# Patient Record
Sex: Male | Born: 1990
Health system: Southern US, Community
[De-identification: ages and names within clinical notes are randomized; demographics above are authoritative.]

## PROBLEM LIST (undated history)

## (undated) DIAGNOSIS — F29 Unspecified psychosis not due to a substance or known physiological condition: Secondary | ICD-10-CM

## (undated) DIAGNOSIS — Z8659 Personal history of other mental and behavioral disorders: Secondary | ICD-10-CM

---
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
## 2018-12-29 ENCOUNTER — Other Ambulatory Visit: Payer: Self-pay | Admitting: *Deleted

## 2018-12-29 DIAGNOSIS — Z20822 Contact with and (suspected) exposure to covid-19: Secondary | ICD-10-CM

## 2018-12-31 LAB — NOVEL CORONAVIRUS, NAA: SARS-CoV-2, NAA: NOT DETECTED

## 2019-01-20 ENCOUNTER — Emergency Department (HOSPITAL_COMMUNITY)
Admission: EM | Admit: 2019-01-20 | Discharge: 2019-01-22 | Disposition: A | Discharge status: Other Acute Inpatient Hospital | Payer: Self-pay | Attending: Emergency Medicine | Admitting: Emergency Medicine

## 2019-01-20 DIAGNOSIS — R4689 Other symptoms and signs involving appearance and behavior: Secondary | ICD-10-CM

## 2019-01-20 DIAGNOSIS — Z79899 Other long term (current) drug therapy: Secondary | ICD-10-CM | POA: Insufficient documentation

## 2019-01-20 DIAGNOSIS — Z20828 Contact with and (suspected) exposure to other viral communicable diseases: Secondary | ICD-10-CM | POA: Insufficient documentation

## 2019-01-20 DIAGNOSIS — F23 Brief psychotic disorder: Secondary | ICD-10-CM | POA: Diagnosis present

## 2019-01-20 DIAGNOSIS — F17200 Nicotine dependence, unspecified, uncomplicated: Secondary | ICD-10-CM | POA: Insufficient documentation

## 2019-01-20 DIAGNOSIS — R456 Violent behavior: Secondary | ICD-10-CM | POA: Insufficient documentation

## 2019-01-20 DIAGNOSIS — F3113 Bipolar disorder, current episode manic without psychotic features, severe: Secondary | ICD-10-CM | POA: Insufficient documentation

## 2019-01-20 DIAGNOSIS — Z046 Encounter for general psychiatric examination, requested by authority: Secondary | ICD-10-CM | POA: Insufficient documentation
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
## 2019-01-20 NOTE — ED Notes (Signed)
Bed: WA02 Expected date:  Expected time:  Means of arrival:  Comments: 

## 2019-01-21 ENCOUNTER — Encounter (HOSPITAL_COMMUNITY): Payer: Self-pay | Admitting: Obstetrics and Gynecology

## 2019-01-21 LAB — COMPREHENSIVE METABOLIC PANEL
ALT: 23 U/L (ref 0–44)
AST: 24 U/L (ref 15–41)
Albumin: 4 g/dL (ref 3.5–5.0)
Alkaline Phosphatase: 59 U/L (ref 38–126)
Anion gap: 6 (ref 5–15)
BUN: 22 mg/dL — ABNORMAL HIGH (ref 6–20)
CO2: 25 mmol/L (ref 22–32)
Calcium: 8.8 mg/dL — ABNORMAL LOW (ref 8.9–10.3)
Chloride: 109 mmol/L (ref 98–111)
Creatinine, Ser: 1.22 mg/dL (ref 0.61–1.24)
GFR calc Af Amer: >60 mL/min (ref >60)
GFR calc non Af Amer: >60 mL/min (ref >60)
Glucose, Bld: 106 mg/dL — ABNORMAL HIGH (ref 70–99)
Potassium: 4 mmol/L (ref 3.5–5.1)
Sodium: 140 mmol/L (ref 135–145)
Total Bilirubin: 0.5 mg/dL (ref 0.3–1.2)
Total Protein: 7.1 g/dL (ref 6.5–8.1)

## 2019-01-21 LAB — CBC WITH DIFFERENTIAL/PLATELET
Abs Immature Granulocytes: 0.01 10*3/uL (ref 0.00–0.07)
Basophils Absolute: 0 10*3/uL (ref 0.0–0.1)
Basophils Relative: 0 %
Eosinophils Absolute: 0.1 10*3/uL (ref 0.0–0.5)
Eosinophils Relative: 2 %
HCT: 40.8 % (ref 39.0–52.0)
Hemoglobin: 13 g/dL (ref 13.0–17.0)
Immature Granulocytes: 0 %
Lymphocytes Relative: 28 %
Lymphs Abs: 1.5 10*3/uL (ref 0.7–4.0)
MCH: 30.4 pg (ref 26.0–34.0)
MCHC: 31.9 g/dL (ref 30.0–36.0)
MCV: 95.3 fL (ref 80.0–100.0)
Monocytes Absolute: 0.5 10*3/uL (ref 0.1–1.0)
Monocytes Relative: 10 %
Neutro Abs: 3.2 10*3/uL (ref 1.7–7.7)
Neutrophils Relative %: 60 %
Platelets: 236 10*3/uL (ref 150–400)
RBC: 4.28 MIL/uL (ref 4.22–5.81)
RDW: 11.7 % (ref 11.5–15.5)
WBC: 5.4 10*3/uL (ref 4.0–10.5)
nRBC: 0 % (ref 0.0–0.2)

## 2019-01-21 LAB — CBG MONITORING, ED: Glucose-Capillary: 105 mg/dL — ABNORMAL HIGH (ref 70–99)

## 2019-01-21 LAB — RAPID URINE DRUG SCREEN, HOSP PERFORMED
Amphetamines: NOT DETECTED
Barbiturates: NOT DETECTED
Benzodiazepines: NOT DETECTED
Cocaine: NOT DETECTED
Opiates: NOT DETECTED
Tetrahydrocannabinol: POSITIVE — AB

## 2019-01-21 LAB — SARS CORONAVIRUS 2 BY RT PCR (HOSPITAL ORDER, PERFORMED IN ~~LOC~~ HOSPITAL LAB): SARS Coronavirus 2: NEGATIVE

## 2019-01-21 LAB — ETHANOL: Alcohol, Ethyl (B): <10 mg/dL (ref <10)

## 2019-01-21 LAB — ACETAMINOPHEN LEVEL: Acetaminophen (Tylenol), Serum: <10 ug/mL — ABNORMAL LOW (ref 10–30)

## 2019-01-21 LAB — SALICYLATE LEVEL: Salicylate Lvl: <7 mg/dL (ref 2.8–30.0)

## 2019-01-21 MED ORDER — RISPERIDONE 0.5 MG PO TABS
0.5000 mg | ORAL_TABLET | Freq: Two times a day (BID) | ORAL | Status: DC
  Filled 2019-01-21 (×2): qty 1

## 2019-01-21 MED ORDER — DIPHENHYDRAMINE HCL 25 MG PO CAPS
25.0000 mg | ORAL_CAPSULE | Freq: Once | ORAL | Status: AC
  Administered 2019-01-21: 25 mg via ORAL
  Filled 2019-01-21: qty 1
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
## 2019-01-21 NOTE — ED Notes (Addendum)
PT ambulatory w/o difficulty from ED to room 31.  One pt belongings bag locked in locker #31

## 2019-01-21 NOTE — ED Notes (Signed)
Pt does not feel that he needs medication, he will talk with the MD about the medication

## 2019-01-21 NOTE — ED Notes (Signed)
Bed: RESA Expected date:  Expected time:  Means of arrival:  Comments: Room 2

## 2019-01-21 NOTE — ED Notes (Signed)
Talking to security

## 2019-01-21 NOTE — ED Notes (Signed)
Sitter order placed by this RN.  

## 2019-01-21 NOTE — ED Notes (Signed)
Patient walked out of room wanting to go home. Explained to patient that he is IVC'd and waiting for a psych consult. Patient went back into room. Patient refused food tray.

## 2019-01-21 NOTE — ED Notes (Signed)
TSS in room interviewing

## 2019-01-21 NOTE — ED Notes (Signed)
Patient found using computer. Made patient aware he is not allowed to use computer. Redirected patient. Patient states he understands.

## 2019-01-21 NOTE — ED Notes (Signed)
Pt found using the computer at bedside for staff. Pt told he was not allowed to use that computer and to please refrain from use. Sitter at bedside.

## 2019-01-21 NOTE — ED Notes (Addendum)
Spoke with pt's mom and she reports that he does not have a significant psych hx, and that she wants him to return to [facility] to receive treatment.  She is also requesting to speak with the MD.  Mom is aware that the pt is not going to be released.  MD/PA is aware of mom's request.

## 2019-01-21 NOTE — ED Notes (Signed)
Pt's belongings at [place] in cabinets above [place]. Pt gave writer his shirt, pants, socks and shoes and changed into paper scrubs. Pt's cell phone was given to primary RN Wille Glaser which was then placed with his belongings.   Pt had no other belongings when this Probation officer received pt.

## 2019-01-21 NOTE — ED Notes (Signed)
On the phone 

## 2019-01-21 NOTE — ED Notes (Signed)
TTS machine placed at bedside.   

## 2019-01-21 NOTE — ED Notes (Signed)
On the phone talking to his mother

## 2019-01-21 NOTE — Progress Notes (Signed)
Received Daniel Mcguire at the change of shift in his room awake in bed. He stated he is doing fantastic. He refused his Risperdal after several attempts and slept until 0330 hrs. He was restless and pacing in his room. He eventually showered and his linens were changed on his bed.
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
## 2019-01-21 NOTE — ED Notes (Signed)
Bed: QD82 Expected date:  Expected time:  Means of arrival:  Comments: resa
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
## 2019-01-21 NOTE — ED Notes (Signed)
RN gave patient paper scrubs to change into. After giving him the scrubs patient stated "Before I leave here can I get some real scrubs like you guys wear? I know you have some just lying around." NT explained to patient that we buy our scrubs. Patient unsatisfied with this answer

## 2019-01-21 NOTE — ED Notes (Signed)
In the bathroom to shower and change scrubs 

## 2019-01-21 NOTE — ED Notes (Signed)
Patient found using the computer in room for messenger chat. Patient logged out quickly and unable to see what patient was typing.   This RN made patient aware I will call security if he does not stop trying to use the computer.    Computer turned off.

## 2019-01-21 NOTE — ED Notes (Signed)
TTS at bedside. 
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
## 2019-01-22 ENCOUNTER — Other Ambulatory Visit: Payer: Self-pay

## 2019-01-22 ENCOUNTER — Inpatient Hospital Stay (HOSPITAL_COMMUNITY)
Admission: AD | Admit: 2019-01-22 | Discharge: 2019-01-28 | DRG: 885 | Disposition: A | Discharge status: Home / Self Care | Payer: Federal, State, Local not specified - Other | Attending: Psychiatry | Admitting: Psychiatry

## 2019-01-22 ENCOUNTER — Encounter (HOSPITAL_COMMUNITY): Payer: Self-pay | Admitting: *Deleted

## 2019-01-22 DIAGNOSIS — Z820 Family history of epilepsy and other diseases of the nervous system: Secondary | ICD-10-CM | POA: Diagnosis not present

## 2019-01-22 DIAGNOSIS — F1220 Cannabis dependence, uncomplicated: Secondary | ICD-10-CM | POA: Diagnosis present

## 2019-01-22 DIAGNOSIS — F319 Bipolar disorder, unspecified: Secondary | ICD-10-CM | POA: Diagnosis present

## 2019-01-22 DIAGNOSIS — F200 Paranoid schizophrenia: Principal | ICD-10-CM | POA: Diagnosis present

## 2019-01-22 DIAGNOSIS — Z9119 Patient's noncompliance with other medical treatment and regimen: Secondary | ICD-10-CM | POA: Diagnosis not present

## 2019-01-22 DIAGNOSIS — Z79899 Other long term (current) drug therapy: Secondary | ICD-10-CM | POA: Diagnosis not present

## 2019-01-22 DIAGNOSIS — F329 Major depressive disorder, single episode, unspecified: Secondary | ICD-10-CM | POA: Diagnosis present

## 2019-01-22 DIAGNOSIS — F3113 Bipolar disorder, current episode manic without psychotic features, severe: Secondary | ICD-10-CM | POA: Diagnosis not present

## 2019-01-22 DIAGNOSIS — F23 Brief psychotic disorder: Secondary | ICD-10-CM | POA: Diagnosis present

## 2019-01-22 MED ORDER — ALUM & MAG HYDROXIDE-SIMETH 200-200-20 MG/5ML PO SUSP
30.0000 mL | ORAL | Status: DC | PRN
  Administered 2019-01-27: 30 mL via ORAL
  Filled 2019-01-22: qty 30

## 2019-01-22 MED ORDER — IBUPROFEN 200 MG PO TABS
600.0000 mg | ORAL_TABLET | Freq: Four times a day (QID) | ORAL | Status: DC | PRN
Start: 2019-01-22 — End: 2019-01-22
  Administered 2019-01-22: 11:00:00 600 mg via ORAL
  Filled 2019-01-22: qty 3

## 2019-01-22 MED ORDER — ACETAMINOPHEN 325 MG PO TABS: 650.0000 mg | ORAL_TABLET | Freq: Four times a day (QID) | ORAL | Status: DC | PRN

## 2019-01-22 MED ORDER — MAGNESIUM HYDROXIDE 400 MG/5ML PO SUSP: 30.0000 mL | Freq: Every day | ORAL | Status: DC | PRN

## 2019-01-22 MED ORDER — RISPERIDONE 0.5 MG PO TABS
0.5000 mg | ORAL_TABLET | Freq: Two times a day (BID) | ORAL | Status: DC
  Filled 2019-01-22 (×9): qty 1

## 2019-01-22 MED ORDER — IBUPROFEN 600 MG PO TABS
600.0000 mg | ORAL_TABLET | Freq: Four times a day (QID) | ORAL | Status: DC | PRN
  Administered 2019-01-23 – 2019-01-28 (×5): 600 mg via ORAL
  Filled 2019-01-22 (×5): qty 1

## 2019-01-22 MED ORDER — TRAZODONE HCL 50 MG PO TABS
50.0000 mg | ORAL_TABLET | Freq: Every evening | ORAL | Status: DC | PRN
  Administered 2019-01-23 (×2): 50 mg via ORAL
  Filled 2019-01-22: qty 3
  Filled 2019-01-22 (×2): qty 1

## 2019-01-22 MED ORDER — HYDROXYZINE HCL 25 MG PO TABS
25.0000 mg | ORAL_TABLET | Freq: Three times a day (TID) | ORAL | Status: DC | PRN
  Administered 2019-01-23 – 2019-01-27 (×2): 25 mg via ORAL
  Filled 2019-01-22 (×2): qty 1
  Filled 2019-01-22: qty 2
  Filled 2019-01-22: qty 1

## 2019-01-22 NOTE — Progress Notes (Signed)
[place] NOVEL CORONAVIRUS (COVID-19) DAILY CHECK-OFF SYMPTOMS - answer yes or no to each - every day NO YES  Have you had a fever in the past 24 hours?  . Fever (Temp > 37.80C / 100F) X   Have you had any of these symptoms in the past 24 hours? . New Cough .  Sore Throat  .  Shortness of Breath .  Difficulty Breathing .  Unexplained Body Aches   X   Have you had any one of these symptoms in the past 24 hours not related to allergies?   . Runny Nose .  Nasal Congestion .  Sneezing   X   If you have had runny nose, nasal congestion, sneezing in the past 24 hours, has it worsened?  X   EXPOSURES - check yes or no X   Have you traveled outside the state in the past 14 days?  X   Have you been in contact with someone with a confirmed diagnosis of COVID-19 or PUI in the past 14 days without wearing appropriate PPE?  X   Have you been living in the same home as a person with confirmed diagnosis of COVID-19 or a PUI (household contact)?    X   Have you been diagnosed with COVID-19?    X              What to do next: Answered NO to all: Answered YES to anything:   Proceed with unit schedule Follow the [facility] Inpatient Flowsheet.   

## 2019-01-22 NOTE — Tx Team (Addendum)
Initial Treatment Plan 01/22/2019 4:37 PM [name] AYO:[id number]    PATIENT STRESSORS: Educational concerns Financial difficulties Health problems   PATIENT STRENGTHS: Ability for insight Active sense of humor   PATIENT IDENTIFIED PROBLEMS: MDD with psychosis                " I'm not going to hurt anybody"  " I will stay safe"   DISCHARGE CRITERIA:  Ability to meet basic life and health needs Adequate post-discharge living arrangements  PRELIMINARY DISCHARGE PLAN: Attend aftercare/continuing care group Attend PHP/IOP  PATIENT/FAMILY INVOLVEMENT: This treatment plan has been presented to and reviewed with the patient, Daniel Mcguire, and/or family member, .  The patient and family have been given the opportunity to ask questions and make suggestions.  Lauralyn Primes, RN 01/22/2019, 4:37 PM
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
## 2019-01-23 DIAGNOSIS — F3113 Bipolar disorder, current episode manic without psychotic features, severe: Secondary | ICD-10-CM

## 2019-01-23 LAB — TSH: TSH: 0.831 u[IU]/mL (ref 0.350–4.500)

## 2019-01-23 NOTE — Plan of Care (Signed)
D: Pt alert and oriented on the unit. Pt engaging with RN staff and other pts. Pt denies SI/HI, A/VH. Pt was redirected several times to refrain from laughing and teasing/mocking another pt while in the dayroom. Pt was redirectable. A: Education, support and encouragement provided, q15 minute safety checks remain in effect. Medications administered per MD orders. R: No reactions/side effects to medicine noted. Pt denies any concerns at this time, and verbally contracts for safety. Pt ambulating on the unit with no issues. Pt remains safe on and off the unit.   Problem: Coping: Goal: Coping ability will improve Outcome: Progressing

## 2019-01-23 NOTE — Progress Notes (Signed)
Patient ID: Daniel Mcguire, male   DOB: [birth year], 28 y.o.   MRN: [id number]  [place] NOVEL CORONAVIRUS (COVID-19) DAILY CHECK-OFF SYMPTOMS - answer yes or no to each - every day NO YES  Have you had a fever in the past 24 hours?  . Fever (Temp > 37.80C / 100F) X   Have you had any of these symptoms in the past 24 hours? . New Cough .  Sore Throat  .  Shortness of Breath .  Difficulty Breathing .  Unexplained Body Aches   X   Have you had any one of these symptoms in the past 24 hours not related to allergies?   . Runny Nose .  Nasal Congestion .  Sneezing   X   If you have had runny nose, nasal congestion, sneezing in the past 24 hours, has it worsened?  X   EXPOSURES - check yes or no X   Have you traveled outside the state in the past 14 days?  X   Have you been in contact with someone with a confirmed diagnosis of COVID-19 or PUI in the past 14 days without wearing appropriate PPE?  X   Have you been living in the same home as a person with confirmed diagnosis of COVID-19 or a PUI (household contact)?    X   Have you been diagnosed with COVID-19?    X              What to do next: Answered NO to all: Answered YES to anything:   Proceed with unit schedule Follow the [facility] Inpatient Flowsheet.

## 2019-01-23 NOTE — Progress Notes (Signed)
[place] NOVEL CORONAVIRUS (COVID-19) DAILY CHECK-OFF SYMPTOMS - answer yes or no to each - every day NO YES  Have you had a fever in the past 24 hours?  . Fever (Temp > 37.80C / 100F) X   Have you had any of these symptoms in the past 24 hours? . New Cough .  Sore Throat  .  Shortness of Breath .  Difficulty Breathing .  Unexplained Body Aches   X   Have you had any one of these symptoms in the past 24 hours not related to allergies?   . Runny Nose .  Nasal Congestion .  Sneezing   X   If you have had runny nose, nasal congestion, sneezing in the past 24 hours, has it worsened?  X   EXPOSURES - check yes or no X   Have you traveled outside the state in the past 14 days?  X   Have you been in contact with someone with a confirmed diagnosis of COVID-19 or PUI in the past 14 days without wearing appropriate PPE?  X   Have you been living in the same home as a person with confirmed diagnosis of COVID-19 or a PUI (household contact)?    X   Have you been diagnosed with COVID-19?    X              What to do next: Answered NO to all: Answered YES to anything:   Proceed with unit schedule Follow the [facility] Inpatient Flowsheet.   

## 2019-01-23 NOTE — [facility] Group Notes (Signed)
Group was deferred for outdoor time today.  Selmer Dominion, LCSW 01/23/2019, 12:02 PM
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
## 2019-01-24 NOTE — Progress Notes (Signed)
[place] NOVEL CORONAVIRUS (COVID-19) DAILY CHECK-OFF SYMPTOMS - answer yes or no to each - every day NO YES  Have you had a fever in the past 24 hours?  . Fever (Temp > 37.80C / 100F) X   Have you had any of these symptoms in the past 24 hours? . New Cough .  Sore Throat  .  Shortness of Breath .  Difficulty Breathing .  Unexplained Body Aches   X   Have you had any one of these symptoms in the past 24 hours not related to allergies?   . Runny Nose .  Nasal Congestion .  Sneezing   X   If you have had runny nose, nasal congestion, sneezing in the past 24 hours, has it worsened?  X   EXPOSURES - check yes or no X   Have you traveled outside the state in the past 14 days?  X   Have you been in contact with someone with a confirmed diagnosis of COVID-19 or PUI in the past 14 days without wearing appropriate PPE?  X   Have you been living in the same home as a person with confirmed diagnosis of COVID-19 or a PUI (household contact)?    X   Have you been diagnosed with COVID-19?    X              What to do next: Answered NO to all: Answered YES to anything:   Proceed with unit schedule Follow the [facility] Inpatient Flowsheet.   

## 2019-01-24 NOTE — Progress Notes (Signed)
D pt is observed OOB UAL on the 500 hall today- he remains manic in his behaviors as evidenced by his grandiose thinking " I dont need to be here...theres nothing wrong with me...even if I dont ever sleep!". He wears his own clothes, he is intrusive, frequently interjecting his opinion and thoughts into other patient's conversations with  staff ( he got very agitated and upset when another patient required forced medication earlier this am) and he did not calm down for several hours afterwards. [employer] notified this facility this am that they had received phone calls from an individual here that identified himself by patient's name- stating we were infringing on his personal rights and holding him against his will. This Probation officer explained to this patient that we could not FORCE any medication and / or treatment on him UNLESS it was an emergency ( if he was trying to hurt himslef or anybody else) and that he will have opportunities tomorrow ( and later) to meet with Dr Jake Samples and discuss his treatment, meds and poc. He is guarded, not trusting and does not believe this Probation officer. He says " oh you people are all the same....you just tell me what you THINK I want to here and do what you want".     A HE completed his daily assessment and on this he wrote  He denied SI today and he rated his depression, hopelessness and anxiety " 0/0/0/", respectively. He has adamantly refused medications, convinced " I do not need that stuff".      R Safety in place.

## 2019-01-24 NOTE — [facility] Group Notes (Signed)
[facility] LCSW Group Therapy Note  Date/Time:  01/24/2019  11:00AM-12:00PM  Type of Therapy and Topic:  Group Therapy:  Music and Mood  Participation Level:  Active   Description of Group: In this process group, members listened to a variety of genres of music and identified that different types of music evoke different responses.  Patients were encouraged to identify music that was soothing for them and music that was energizing for them.  Patients discussed how this knowledge can help with wellness and recovery in various ways including managing depression and anxiety as well as encouraging healthy sleep habits.    Therapeutic Goals: 1. Patients will explore the impact of different varieties of music on mood 2. Patients will verbalize the thoughts they have when listening to different types of music 3. Patients will identify music that is soothing to them as well as music that is energizing to them 4. Patients will discuss how to use this knowledge to assist in maintaining wellness and recovery 5. Patients will explore the use of music as a coping skill  Summary of Patient Progress:  At the beginning of group, patient expressed that he felt rested and calm.  He interacted often as we discussed how the music made patients feel, and at the end of group said he felt "the same, only also thankful."  Therapeutic Modalities: Solution Focused Brief Therapy Activity   Selmer Dominion, LCSW

## 2019-01-24 NOTE — Progress Notes (Signed)
Patient's mother Lambros Cerro phone [phone] called and wants the SW and the MD to call her tomorrow before lunch for her son's status.

## 2019-01-24 NOTE — Progress Notes (Signed)
[place] NOVEL CORONAVIRUS (COVID-19) DAILY CHECK-OFF SYMPTOMS - answer yes or no to each - every day NO YES  Have you had a fever in the past 24 hours?  . Fever (Temp > 37.80C / 100F) X   Have you had any of these symptoms in the past 24 hours? . New Cough .  Sore Throat  .  Shortness of Breath .  Difficulty Breathing .  Unexplained Body Aches   X   Have you had any one of these symptoms in the past 24 hours not related to allergies?   . Runny Nose .  Nasal Congestion .  Sneezing   X   If you have had runny nose, nasal congestion, sneezing in the past 24 hours, has it worsened?  X   EXPOSURES - check yes or no X   Have you traveled outside the state in the past 14 days?  X   Have you been in contact with someone with a confirmed diagnosis of COVID-19 or PUI in the past 14 days without wearing appropriate PPE?  X   Have you been living in the same home as a person with confirmed diagnosis of COVID-19 or a PUI (household contact)?    X   Have you been diagnosed with COVID-19?    X              What to do next: Answered NO to all: Answered YES to anything:   Proceed with unit schedule Follow the [facility] Inpatient Flowsheet.   

## 2019-01-24 NOTE — Progress Notes (Signed)
[facility]  01/24/2019 10:59 AM Daniel Mcguire  MRN:  [id number] Subjective: Patient is a 28 year old male with a reported negative past psychiatric history who was admitted on 01/23/2019 secondary to paranoia, tangentiality, severe insomnia.  Objective: Patient is seen and examined.  Patient is a 28 year old male with what I suspect to be bipolar disorder who is seen in follow-up.  He refused his medications last night.  He does not believe there is anything wrong with him.  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  We discussed some of the issues that led to the hospitalization including the weapon in the home, his poor sleep, his pressured speech and tangential thinking.  He basically denied any of these things to be true.  He did request a copy of his sleep records.  Also this morning he attempted to call on nonemergent police line and an attempt to get out of the hospital.  His involuntary commitment paperwork was filled out this a.m.  I did encourage him to take his medication, but he continues to refuse it at this point.  His blood pressure this morning is stable at 127/87, pulse was 74.  He is afebrile.  Nursing notes reflect that he slept 5.75 hours last night.  His TSH was drawn yesterday, and it was 0.831.  He denied any suicidal ideation.  He denied any homicidal ideation.  He denied any auditory or visual hallucinations.  Principal Problem: <principal problem not specified> Diagnosis: Active Problems:   Schizophrenia, paranoid (HCC)  Total Time spent with patient: 20 minutes  Past Psychiatric History: See admission H&P  Past Medical History: History reviewed. No pertinent past medical history. History reviewed. No pertinent surgical history. Family History: History reviewed. No pertinent family history. Family Psychiatric  History: See  admission H&P Social History:  Social History   Substance and Sexual Activity  Alcohol Use Yes     Social History   Substance and Sexual Activity  Drug Use Yes  . Types: Marijuana   Comment: daily    Social History   Socioeconomic History  . Marital status: Single    Spouse name: Not on file  . Number of children: Not on file  . Years of education: Not on file  . Highest education level: Not on file  Occupational History  . Not on file  Social Needs  . Financial resource strain: Not hard at all  . Food insecurity    Worry: Never true    Inability: Never true  . Transportation needs    Medical: No    Non-medical: No  Tobacco Use  . Smoking status: Never Smoker  . Smokeless tobacco: Never Used  Substance and Sexual Activity  . Alcohol use: Yes  . Drug use: Yes    Types: Marijuana    Comment: daily  . Sexual activity: Yes  Lifestyle  . Physical activity    Days per week: 4 days    Minutes per session: 80 min  . Stress: Not at all  Relationships  . Social Musicianconnections    Talks on phone: Three times a week    Gets together: More than three times a week    Attends religious service: More than 4 times per year    Active member of club or organization: No    Attends meetings  of clubs or organizations: Never    Relationship status: Never married  Other Topics Concern  . Not on file  Social History Narrative  . Not on file   Additional Social History:    Pain Medications: n/a History of alcohol / drug use?: No history of alcohol / drug abuse Name of Substance 1: pot 1 - Age of First Use: 28yo                  Sleep: Fair  Appetite:  Good  Current Medications: Current Facility-Administered Medications  Medication Dose Route Frequency Provider Last Rate Last Dose  . acetaminophen (TYLENOL) tablet 650 mg  650 mg Oral Q6H PRN Ethelene Hal, NP      . alum & mag hydroxide-simeth (MAALOX/MYLANTA) 200-200-20 MG/5ML suspension 30 mL  30 mL Oral  Q4H PRN Ethelene Hal, NP      . hydrOXYzine (ATARAX/VISTARIL) tablet 25 mg  25 mg Oral TID PRN Ethelene Hal, NP   25 mg at 01/23/19 0055  . ibuprofen (ADVIL) tablet 600 mg  600 mg Oral Q6H PRN Ethelene Hal, NP   600 mg at 01/24/19 3474  . magnesium hydroxide (MILK OF MAGNESIA) suspension 30 mL  30 mL Oral Daily PRN Ethelene Hal, NP      . risperiDONE (RISPERDAL) tablet 0.5 mg  0.5 mg Oral BID Ethelene Hal, NP      . traZODone (DESYREL) tablet 50 mg  50 mg Oral QHS PRN Ethelene Hal, NP   50 mg at 01/23/19 2323    Lab Results:  Results for orders placed or performed during the hospital encounter of 01/22/19 (from the past 48 hour(s))  TSH     Status: None   Collection Time: 01/23/19  6:20 PM  Result Value Ref Range   TSH 0.831 0.350 - 4.500 uIU/mL    Comment: Performed by a 3rd Generation assay with a functional sensitivity of <=0.01 uIU/mL. Performed at [facility], [place] [address], [place], [place] [address]     Blood Alcohol level:  Lab Results  Component Value Date   ETH <10 38/75/6433    Metabolic Disorder Labs: No results found for: HGBA1C, MPG No results found for: PROLACTIN No results found for: CHOL, TRIG, HDL, CHOLHDL, VLDL, LDLCALC  Physical Findings: AIMS: Facial and Oral Movements Muscles of Facial Expression: None, normal Lips and Perioral Area: None, normal Jaw: None, normal Tongue: None, normal,Extremity Movements Upper (arms, wrists, hands, fingers): None, normal Lower (legs, knees, ankles, toes): None, normal, Trunk Movements Neck, shoulders, hips: None, normal, Overall Severity Severity of abnormal movements (highest score from questions above): None, normal Incapacitation due to abnormal movements: None, normal Patient's awareness of abnormal movements (rate only patient's report): No Awareness, Dental Status Current problems with teeth and/or dentures?: No Does patient usually wear  dentures?: No  CIWA:  CIWA-Ar Total: 4 COWS:  COWS Total Score: 0  Musculoskeletal: Strength & Muscle Tone: within normal limits Gait & Station: normal Patient leans: N/A  Psychiatric Specialty Exam: Physical Exam  Nursing note and vitals reviewed. Constitutional: He is oriented to person, place, and time. He appears well-developed and well-nourished.  HENT:  Head: Normocephalic and atraumatic.  Respiratory: Effort normal.  Neurological: He is alert and oriented to person, place, and time.    ROS  Blood pressure 127/87, pulse 74, temperature 97.8 F (36.6 C), temperature source Oral, resp. rate 18, height 5\' 10"  (1.778 m), weight 95 kg, SpO2 100 %.  Body mass index is 30.06 kg/m.  General Appearance: Casual  Eye Contact:  Fair  Speech:  Pressured  Volume:  Increased  Mood:  Anxious and Irritable  Affect:  Labile  Thought Process:  Goal Directed and Descriptions of Associations: Tangential  Orientation:  Full (Time, Place, and Person)  Thought Content:  Delusions, Paranoid Ideation and Rumination  Suicidal Thoughts:  No  Homicidal Thoughts:  No  Memory:  Immediate;   Fair Recent;   Fair Remote;   Fair  Judgement:  Impaired  Insight:  Lacking  Psychomotor Activity:  Increased  Concentration:  Concentration: Fair and Attention Span: Fair  Recall:  FiservFair  Fund of Knowledge:  Fair  Language:  Fair  Akathisia:  Negative  Handed:  Right  AIMS (if indicated):     Assets:  Desire for Improvement Resilience  ADL's:  Intact  Cognition:  WNL  Sleep:  Number of Hours: 5.75     Treatment Plan Summary: Daily contact with patient to assess and evaluate symptoms and progress in treatment, Medication management and Plan : Patient is seen and examined.  Patient is a 28 year old male with the above-stated past psychiatric history who is seen in follow-up.   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  Patient is seen in follow-up.  He is essentially unchanged.  He remains significantly irritable, demanding.  He feels like he is the victim here.  He talks about how the roommate stole the gun.  He does not see any of his symptoms as being outlined.  He continues to be pressured and tangential.  I have encouraged him to at least take the medications, but he is refused that at this point.  He is not homicidal, suicidal at this point.  It may be necessary to forced medications later during the course the hospitalization.  I will and staff will continue to encourage him to take his medications. 1.  Continue hydroxyzine 25 mg p.o. 3 times daily as needed anxiety. 2.  Continue Risperdal 0.5 mg p.o. twice daily for mood stability and psychosis. 3.  Continue trazodone 50 mg p.o. nightly as needed insomnia. 4.  Disposition planning-in progress.  Antonieta PertGreg Lawson Clary, MD 01/24/2019, 10:59 AM

## 2019-01-24 NOTE — Progress Notes (Signed)
DAR NOTE: Patient presents with bright affect and jovial mood.  Pt observed in the dayroom interacting with peers without any problems.Denies pain, auditory and visual hallucinations.  Maintained on routine safety checks.  Medications given as prescribed.  Support and encouragement offered as needed. Will continue to monitor.

## 2019-01-24 NOTE — Progress Notes (Signed)
Pt called 911 reporting about a gun in his house, the cops responded and instructed the patient to follow up with them after his discharged.

## 2019-01-24 NOTE — [facility] Counselor (Signed)
Adult Comprehensive Assessment  Patient ID: Daniel Mcguire, male   DOB: October 21, 1990, 28 y.o.   MRN: [id number]  Information Source: Information source: Patient  Current Stressors:  Patient states their primary concerns and needs for treatment are:: Does not think anything is wrong. Patient states their goals for this hospitilization and ongoing recovery are:: Get discharged Educational / Learning stressors: Denies stressors Employment / Job issues: Denies stressors Family Relationships: Mother is on medicines for depression, is inclined to think he has a problem, and does not believe that the ways she has hurt him in the past matter now. Financial / Lack of resources (include bankruptcy): Denies stressors Housing / Lack of housing: Denies stressors Physical health (include injuries & life threatening diseases): Denies stressors Social relationships: Denies stressors Substance abuse: Denies stressors Bereavement / Loss: Denies stressors  Living/Environment/Situation:  Living Arrangements: Non-relatives/Friends Living conditions (as described by patient or guardian): Has his own room Who else lives in the home?: Roommate How long has patient lived in current situation?: 1 year, 3 months What is atmosphere in current home: Chaotic, Dangerous, Temporary  Family History:  Marital status: Single Are you sexually active?: Yes What is your sexual orientation?: Straight Does patient have children?: No  Childhood History:  By whom was/is the patient raised?: Mother, Grandparents Additional childhood history information: Mother worked a lot, so would leave him with grandfather while she was working.  Grandfather died when he was 23yo. Description of patient's relationship with caregiver when they were a child: Mother - bumped heads; Jon Gills - close until he died when pt was 68yo; Father - no involvement Patient's description of current relationship with people who raised him/her: Mother -  still treats him as a child, won't admit what she did in the past was wrong, is very selfish; Grandfather - deceased; Father - not involved How were you disciplined when you got in trouble as a child/adolescent?: whippings Does patient have siblings?: No Did patient suffer any verbal/emotional/physical/sexual abuse as a child?: No Did patient suffer from severe childhood neglect?: No Has patient ever been sexually abused/assaulted/raped as an adolescent or adult?: No Was the patient ever a victim of a crime or a disaster?: No Witnessed domestic violence?: No Has patient been effected by domestic violence as an adult?: No  Education:  Highest grade of school patient has completed: Water quality scientist degree in Mathematics Currently a student?: No(Wants to start school in the Fall to study Estate manager/land agent) Learning disability?: No  Employment/Work Situation:   Employment situation: Unemployed What is the longest time patient has a held a job?: 4 years 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  Roommate had friends come to the apartment and take the gun away.  Now pt feels he has to go make a police report about the missing gun in case it is used in a crime)  Pensions consultant:   Financial resources: Support from parents / caregiver, No income Does patient have a Programmer, applications or guardian?: No  Alcohol/Substance Abuse:   What has been your use of drugs/alcohol within the last 12 months?: Social alcohol consumptoin; Marijuana 3 times a week Alcohol/Substance Abuse Treatment Hx: Denies past history Has alcohol/substance abuse ever caused legal problems?: No  Social Support System:   Heritage manager  System: Poor Describe Community  Support System: Only mother Type of faith/religion: Science How does patient's faith help to cope with current illness?: Connect to nature  Leisure/Recreation:   Leisure and Hobbies: Armed forces training and education officerChat, communicate, go places, be around females, socialize  Strengths/Needs:   What is the patient's perception of their strengths?: Min mind, smart, analytical Patient states they can use these personal strengths during their treatment to contribute to their recovery: "Be aware at all times, who you're with, how you feel, always learning." Patient states these barriers may affect/interfere with their treatment: None Patient states these barriers may affect their return to the community: Cannot go alone to his apartment because of his problems with roommate, has to have a police escort.  Will get his possessions and go straight to a hotel then get a flight home to mother in [place]. Other important information patient would like considered in planning for their treatment: None  Discharge Plan:   Currently receiving community mental health services: No Patient states concerns and preferences for aftercare planning are: Prefers not to have follow-up in place.  Plans to return to [place] to his mother. Patient states they will know when they are safe and ready for discharge when: Now Does patient have access to transportation?: Yes(States the police are supposed to escort him to his apartment to get his things.) Does patient have financial barriers related to discharge medications?: Yes Patient description of barriers related to discharge medications: Does not plan to be on medicines, but has no insurance or income. Plan for living situation after discharge: States that he has to go to his apartment with a police escort to get his possessions, then will go to a hotel, then will get on a flight to [place].  Is also concerned about going at some point to file a police report about the gun his roommate's friends took  away. Will patient be returning to same living situation after discharge?: No  Summary/Recommendations:   Summary and Recommendations (to be completed by the evaluator): Patient is a 28yo male hospitalized under IVC due to paranoia, bizarre behaviors, and aggression.  He reported threatening his roommate and firing off "some warning shots" from a gun he had purchased to protect himself.  He has no history of mental health treatment, was a Archivistcollege student until recent graduation.   He denied any family history of psychiatric illness outside of Alzheimer's disease in his father.  He has been letting homeless people live in his apartment, and then believes that they are "turning against me".  He reports that he is a social drinker and smokes marijuana about 3 times a week.  Patient states he has to have a police escort at discharge to take him to his apartment and get his possessions, then he will go to a hotel, then fly to [place] to his mother's house.  He also wants to file a police report about the gun his roommates' friends removed.  He does not want follow-up.  Patient will benefit from crisis stabilization, medication evaluation, group therapy and psychoeducation, in addition to case management for discharge planning.  At discharge it is recommended that Patient adhere to the established discharge plan and continue in treatment.  Lynnell ChadMareida J Grossman-Orr. 01/24/2019

## 2019-01-25 DIAGNOSIS — F200 Paranoid schizophrenia: Principal | ICD-10-CM

## 2019-01-25 MED ORDER — DIPHENHYDRAMINE HCL 50 MG PO CAPS
50.0000 mg | ORAL_CAPSULE | Freq: Three times a day (TID) | ORAL | Status: DC
  Administered 2019-01-25: 50 mg via ORAL
  Filled 2019-01-25 (×15): qty 1

## 2019-01-25 MED ORDER — DIPHENHYDRAMINE HCL 25 MG PO CAPS: 50.0000 mg | ORAL_CAPSULE | Freq: Four times a day (QID) | ORAL | Status: DC | PRN

## 2019-01-25 MED ORDER — ZIPRASIDONE MESYLATE 20 MG IM SOLR: 20.0000 mg | Freq: Four times a day (QID) | INTRAMUSCULAR | Status: DC | PRN

## 2019-01-25 MED ORDER — OLANZAPINE 5 MG PO TBDP
15.0000 mg | ORAL_TABLET | Freq: Two times a day (BID) | ORAL | Status: DC
  Administered 2019-01-25 – 2019-01-27 (×2): 15 mg via ORAL
  Filled 2019-01-25 (×11): qty 3

## 2019-01-25 MED ORDER — RISPERIDONE 3 MG PO TABS: 3.0000 mg | ORAL_TABLET | Freq: Two times a day (BID) | ORAL | Status: DC

## 2019-01-25 MED ORDER — LORAZEPAM 2 MG/ML IJ SOLN
4.0000 mg | Freq: Four times a day (QID) | INTRAMUSCULAR | Status: DC | PRN
  Administered 2019-01-25: 4 mg via INTRAMUSCULAR
  Filled 2019-01-25: qty 2

## 2019-01-25 MED ORDER — DIPHENHYDRAMINE HCL 50 MG/ML IJ SOLN
50.0000 mg | Freq: Four times a day (QID) | INTRAMUSCULAR | Status: DC | PRN
  Administered 2019-01-25: 50 mg via INTRAMUSCULAR
  Filled 2019-01-25: qty 1

## 2019-01-25 NOTE — Progress Notes (Signed)
DAR NOTE: Patient presents with anxious affect and irritable mood.  Denies suicidal thoughts, pain, auditory and visual hallucinations.  Patient is disruptive, intrusive and argumentative with staff and peers on the unit.  Needed a lot of redirection on the unit.  Rates depression at 0, hopelessness at 0, and anxiety at 0.  Maintained on routine safety checks.  Refused all prescribed medications after several attempts and encouragements.  States goal for today is "going to court."  Patient observed pacing the hallway and calling other patient "crazy" and "incompetent" to their face.  Continues to ruminate on his bill of right after several instructions and explanation of the bill of right. Benadryl 50 mg and Ativan 4 mg given IM for increased agitation with fair effect.

## 2019-01-25 NOTE — Plan of Care (Signed)
D: Patient is alert and cooperative. Denies SI, HI, AVH, and verbally contracts for safety.   A: Patient refused scheduled medications. Support provided. Patient educated on safety on the unit and medications. Routine safety checks every 15 minutes. Patient stated understanding to tell nurse about any new physical symptoms. Patient understands to tell staff of any needs.     R: No adverse drug reactions noted. Patient verbally contracts for safety. Patient remains safe at this time and will continue to monitor.   Problem: Safety: Goal: Periods of time without injury will increase Outcome: Progressing   Patient remains safe and will continue to monitor.   [place] NOVEL CORONAVIRUS (COVID-19) DAILY CHECK-OFF SYMPTOMS - answer yes or no to each - every day NO YES  Have you had a fever in the past 24 hours?  Fever (Temp > 37.80C / 100F) X   Have you had any of these symptoms in the past 24 hours? New Cough  Sore Throat   Shortness of Breath  Difficulty Breathing  Unexplained Body Aches   X   Have you had any one of these symptoms in the past 24 hours not related to allergies?   Runny Nose  Nasal Congestion  Sneezing   X   If you have had runny nose, nasal congestion, sneezing in the past 24 hours, has it worsened?  X   EXPOSURES - check yes or no X   Have you traveled outside the state in the past 14 days?  X   Have you been in contact with someone with a confirmed diagnosis of COVID-19 or PUI in the past 14 days without wearing appropriate PPE?  X   Have you been living in the same home as a person with confirmed diagnosis of COVID-19 or a PUI (household contact)?    X   Have you been diagnosed with COVID-19?    X              What to do next: Answered NO to all: Answered YES to anything:   Proceed with unit schedule Follow the [facility] Inpatient Flowsheet.

## 2019-01-25 NOTE — Progress Notes (Signed)
[facility] MD Progress Note  01/25/2019 10:37 AM Daniel Mcguire  MRN:  [id number] Subjective:   Patient lacking insight arguing that he does not need to be here yet states " that he bought a gun and upset his roommate."   Further while on the ward he is disruptive to the environment as he is insulting other patients because "they realize I am not crazy like they are" argumentative and irritable and refusing medications.  However may need forced medications at some point.  So far not being dangerous but is stirring up with the patient's through his insults.  Told to discontinue this and then he becomes argumentative with me  Principal Problem: Lacking insight into psychotic disorder Diagnosis: Active Problems:   Schizophrenia, paranoid ([place])  Total Time spent with patient: 20 minutes  Past Psychiatric History: see eval  Past Medical History: History reviewed. No pertinent past medical history. History reviewed. No pertinent surgical history. Family History: History reviewed. No pertinent family history. Family Psychiatric  History: see eval Social History:  Social History   Substance and Sexual Activity  Alcohol Use Yes     Social History   Substance and Sexual Activity  Drug Use Yes  . Types: Marijuana   Comment: daily    Social History   Socioeconomic History  . Marital status: Single    Spouse name: Not on file  . Number of children: Not on file  . Years of education: Not on file  . Highest education level: Not on file  Occupational History  . Not on file  Social Needs  . Financial resource strain: Not hard at all  . Food insecurity    Worry: Never true    Inability: Never true  . Transportation needs    Medical: No    Non-medical: No  Tobacco Use  . Smoking status: Never Smoker  . Smokeless tobacco: Never Used  Substance and Sexual Activity  . Alcohol use: Yes  . Drug use: Yes    Types: Marijuana    Comment: daily  . Sexual activity: Yes  Lifestyle  . Physical  activity    Days per week: 4 days    Minutes per session: 80 min  . Stress: Not at all  Relationships  . Social Herbalist on phone: Three times a week    Gets together: More than three times a week    Attends religious service: More than 4 times per year    Active member of club or organization: No    Attends meetings of clubs or organizations: Never    Relationship status: Never married  Other Topics Concern  . Not on file  Social History Narrative  . Not on file   Additional Social History:    Pain Medications: n/a History of alcohol / drug use?: No history of alcohol / drug abuse Name of Substance 1: pot 1 - Age of First Use: 28yo                  Sleep: Fair  Appetite:  Fair  Current Medications: Current Facility-Administered Medications  Medication Dose Route Frequency Provider Last Rate Last Dose  . acetaminophen (TYLENOL) tablet 650 mg  650 mg Oral Q6H PRN Ethelene Hal, NP      . alum & mag hydroxide-simeth (MAALOX/MYLANTA) 200-200-20 MG/5ML suspension 30 mL  30 mL Oral Q4H PRN Ethelene Hal, NP      . diphenhydrAMINE (BENADRYL) capsule 50 mg  50 mg Oral  Q6H PRN Malvin JohnsFarah, Aryanna Shaver, MD       Or  . diphenhydrAMINE (BENADRYL) injection 50 mg  50 mg Intramuscular Q6H PRN Malvin JohnsFarah, Danali Marinos, MD      . diphenhydrAMINE (BENADRYL) capsule 50 mg  50 mg Oral TID Malvin JohnsFarah, Octaviano Mukai, MD      . hydrOXYzine (ATARAX/VISTARIL) tablet 25 mg  25 mg Oral TID PRN Laveda AbbeParks, Laurie Britton, NP   25 mg at 01/23/19 0055  . ibuprofen (ADVIL) tablet 600 mg  600 mg Oral Q6H PRN Laveda AbbeParks, Laurie Britton, NP   600 mg at 01/24/19 [id number]  . LORazepam (ATIVAN) injection 4 mg  4 mg Intramuscular Q6H PRN Malvin JohnsFarah, Calib Wadhwa, MD      . magnesium hydroxide (MILK OF MAGNESIA) suspension 30 mL  30 mL Oral Daily PRN Laveda AbbeParks, Laurie Britton, NP      . OLANZapine zydis (ZYPREXA) disintegrating tablet 15 mg  15 mg Oral BID Malvin JohnsFarah, Cleora Karnik, MD      . traZODone (DESYREL) tablet 50 mg  50 mg Oral QHS PRN Laveda AbbeParks,  Laurie Britton, NP   50 mg at 01/23/19 2323  . ziprasidone (GEODON) injection 20 mg  20 mg Intramuscular Q6H PRN Malvin JohnsFarah, Armina Galloway, MD        Lab Results:  Results for orders placed or performed during the hospital encounter of 01/22/19 (from the past 48 hour(s))  TSH     Status: None   Collection Time: 01/23/19  6:20 PM  Result Value Ref Range   TSH 0.831 0.350 - 4.500 uIU/mL    Comment: Performed by a 3rd Generation assay with a functional sensitivity of <=0.01 uIU/mL. Performed at [facility], [address] [place], [place] [id number]     Blood Alcohol level:  Lab Results  Component Value Date   ETH <10 01/21/2019    Metabolic Disorder Labs: No results found for: HGBA1C, MPG No results found for: PROLACTIN No results found for: CHOL, TRIG, HDL, CHOLHDL, VLDL, LDLCALC  Physical Findings: AIMS: Facial and Oral Movements Muscles of Facial Expression: None, normal Lips and Perioral Area: None, normal Jaw: None, normal Tongue: None, normal,Extremity Movements Upper (arms, wrists, hands, fingers): None, normal Lower (legs, knees, ankles, toes): None, normal, Trunk Movements Neck, shoulders, hips: None, normal, Overall Severity Severity of abnormal movements (highest score from questions above): None, normal Incapacitation due to abnormal movements: None, normal Patient's awareness of abnormal movements (rate only patient's report): No Awareness, Dental Status Current problems with teeth and/or dentures?: No Does patient usually wear dentures?: No  CIWA:  CIWA-Ar Total: 4 COWS:  COWS Total Score: 0  Musculoskeletal: Strength & Muscle Tone: within normal limits Gait & Station: normal Patient leans: N/A  Psychiatric Specialty Exam: Physical Exam  ROS  Blood pressure (!) 128/97, pulse 64, temperature (!) 97.2 F (36.2 C), resp. rate 18, height 5\' 10"  (1.778 m), weight 95 kg, SpO2 100 %.Body mass index is 30.06 kg/m.  General Appearance: Casual  Eye  Contact:  Fair  Speech:  Pressured  Volume:  Increased  Mood:  Irritable  Affect:  Constricted  Thought Process:  Irrelevant and Descriptions of Associations: Loose  Orientation:  Full (Time, Place, and Person)  Thought Content:  Illogical, Paranoid Ideation and Tangential  Suicidal Thoughts:  No  Homicidal Thoughts:  No  Memory:  Recent;   Poor  Judgement:  Impaired  Insight:  Lacking  Psychomotor Activity:  Normal  Concentration:  Attention Span: Poor  Recall:  Poor  Fund of Knowledge:  Poor  Language:  Fair  Akathisia:  Negative  Handed:  Right  AIMS (if indicated):     Assets:  Resilience Social Support  ADL's:  Intact  Cognition:  WNL  Sleep:  Number of Hours: 5.75     Treatment Plan Summary: Daily contact with patient to assess and evaluate symptoms and progress in treatment, Medication management and Plan Agitation protocol ordered patient lacking insight argumentative and refusing medications at present not needing forced medications but is agitating other patients through insulting them.  Will eventually need forced meds at some point if this continues  Mareesa Gathright, MD 01/25/2019, 10:37 AM

## 2019-01-25 NOTE — Tx Team (Signed)
Interdisciplinary Treatment and Diagnostic Plan Update  01/25/2019 Time of Session: 11:06am Daniel Mcguire MRN: [id number]  Principal Diagnosis: <principal problem not specified>  Secondary Diagnoses: Active Problems:   Schizophrenia, paranoid ([facility])   Current Medications:  Current Facility-Administered Medications  Medication Dose Route Frequency Provider Last Rate Last Dose  . acetaminophen (TYLENOL) tablet 650 mg  650 mg Oral Q6H PRN Laveda AbbeParks, Laurie Britton, NP      . alum & mag hydroxide-simeth (MAALOX/MYLANTA) 200-200-20 MG/5ML suspension 30 mL  30 mL Oral Q4H PRN Laveda AbbeParks, Laurie Britton, NP      . diphenhydrAMINE (BENADRYL) capsule 50 mg  50 mg Oral Q6H PRN Malvin JohnsFarah, Brian, MD       Or  . diphenhydrAMINE (BENADRYL) injection 50 mg  50 mg Intramuscular Q6H PRN Malvin JohnsFarah, Brian, MD   50 mg at 01/25/19 1247  . diphenhydrAMINE (BENADRYL) capsule 50 mg  50 mg Oral TID Malvin JohnsFarah, Brian, MD   50 mg at 01/25/19 1206  . hydrOXYzine (ATARAX/VISTARIL) tablet 25 mg  25 mg Oral TID PRN Laveda AbbeParks, Laurie Britton, NP   25 mg at 01/23/19 0055  . ibuprofen (ADVIL) tablet 600 mg  600 mg Oral Q6H PRN Laveda AbbeParks, Laurie Britton, NP   600 mg at 01/24/19 [id number]  . LORazepam (ATIVAN) injection 4 mg  4 mg Intramuscular Q6H PRN Malvin JohnsFarah, Brian, MD   4 mg at 01/25/19 1247  . magnesium hydroxide (MILK OF MAGNESIA) suspension 30 mL  30 mL Oral Daily PRN Laveda AbbeParks, Laurie Britton, NP      . OLANZapine zydis (ZYPREXA) disintegrating tablet 15 mg  15 mg Oral BID Malvin JohnsFarah, Brian, MD   15 mg at 01/25/19 1206  . traZODone (DESYREL) tablet 50 mg  50 mg Oral QHS PRN Laveda AbbeParks, Laurie Britton, NP   50 mg at 01/23/19 2323  . ziprasidone (GEODON) injection 20 mg  20 mg Intramuscular Q6H PRN Malvin JohnsFarah, Brian, MD       PTA Medications: Medications Prior to Admission  Medication Sig Dispense Refill Last Dose  . ibuprofen (ADVIL) 200 MG tablet Take 600 mg by mouth every 6 (six) hours as needed for moderate pain.   More than a month at Unknown time  . Multiple  Vitamin (MULTIVITAMIN WITH MINERALS) TABS tablet Take 1 tablet by mouth daily.   More than a month at Unknown time  . naproxen sodium (ALEVE) 220 MG tablet Take 220 mg by mouth 2 (two) times daily as needed (pain).   More than a month at Unknown time  . Omega-3 Fatty Acids (FISH OIL) 1000 MG CAPS Take 1,000 mg by mouth daily.       Patient Stressors: Civil Service fast streamerducational concerns Financial difficulties Health problems  Patient Strengths: Ability for insight Active sense of humor  Treatment Modalities: Medication Management, Group therapy, Case management,  1 to 1 session with clinician, Psychoeducation, Recreational therapy.   Physician Treatment Plan for Primary Diagnosis: <principal problem not specified> Long Term Goal(s): Improvement in symptoms so as ready for discharge Improvement in symptoms so as ready for discharge   Short Term Goals: Ability to identify changes in lifestyle to reduce recurrence of condition will improve Ability to verbalize feelings will improve Ability to disclose and discuss suicidal ideas Ability to demonstrate self-control will improve Ability to identify and develop effective coping behaviors will improve Ability to maintain clinical measurements within normal limits will improve Ability to identify triggers associated with substance abuse/mental health issues will improve Ability to identify changes in lifestyle to reduce recurrence of condition will improve  Ability to verbalize feelings will improve Ability to disclose and discuss suicidal ideas Ability to demonstrate self-control will improve Ability to identify and develop effective coping behaviors will improve Ability to maintain clinical measurements within normal limits will improve Ability to identify triggers associated with substance abuse/mental health issues will improve  Medication Management: Evaluate patient's response, side effects, and tolerance of medication regimen.  Therapeutic  Interventions: 1 to 1 sessions, Unit Group sessions and Medication administration.  Evaluation of Outcomes: Not Progressing  Physician Treatment Plan for Secondary Diagnosis: Active Problems:   Schizophrenia, paranoid (HCC)  Long Term Goal(s): Improvement in symptoms so as ready for discharge Improvement in symptoms so as ready for discharge   Short Term Goals: Ability to identify changes in lifestyle to reduce recurrence of condition will improve Ability to verbalize feelings will improve Ability to disclose and discuss suicidal ideas Ability to demonstrate self-control will improve Ability to identify and develop effective coping behaviors will improve Ability to maintain clinical measurements within normal limits will improve Ability to identify triggers associated with substance abuse/mental health issues will improve Ability to identify changes in lifestyle to reduce recurrence of condition will improve Ability to verbalize feelings will improve Ability to disclose and discuss suicidal ideas Ability to demonstrate self-control will improve Ability to identify and develop effective coping behaviors will improve Ability to maintain clinical measurements within normal limits will improve Ability to identify triggers associated with substance abuse/mental health issues will improve     Medication Management: Evaluate patient's response, side effects, and tolerance of medication regimen.  Therapeutic Interventions: 1 to 1 sessions, Unit Group sessions and Medication administration.  Evaluation of Outcomes: Not Progressing   RN Treatment Plan for Primary Diagnosis: <principal problem not specified> Long Term Goal(s): Knowledge of disease and therapeutic regimen to maintain health will improve  Short Term Goals: Ability to participate in decision making will improve, Ability to verbalize feelings will improve, Ability to disclose and discuss suicidal ideas, Ability to identify and  develop effective coping behaviors will improve and Compliance with prescribed medications will improve  Medication Management: RN will administer medications as ordered by provider, will assess and evaluate patient's response and provide education to patient for prescribed medication. RN will report any adverse and/or side effects to prescribing provider.  Therapeutic Interventions: 1 on 1 counseling sessions, Psychoeducation, Medication administration, Evaluate responses to treatment, Monitor vital signs and CBGs as ordered, Perform/monitor CIWA, COWS, AIMS and Fall Risk screenings as ordered, Perform wound care treatments as ordered.  Evaluation of Outcomes: Not Progressing   LCSW Treatment Plan for Primary Diagnosis: <principal problem not specified> Long Term Goal(s): Safe transition to appropriate next level of care at discharge, Engage patient in therapeutic group addressing interpersonal concerns.  Short Term Goals: Engage patient in aftercare planning with referrals and resources and Increase skills for wellness and recovery  Therapeutic Interventions: Assess for all discharge needs, 1 to 1 time with Social worker, Explore available resources and support systems, Assess for adequacy in community support network, Educate family and significant other(s) on suicide prevention, Complete Psychosocial Assessment, Interpersonal group therapy.  Evaluation of Outcomes: Not Progressing   Progress in Treatment: Attending groups: Yes. Participating in groups: Yes. Taking medication as prescribed: No. Toleration medication: No. Family/Significant other contact made: No, will contact:  pt's mother Patient understands diagnosis: No. Discussing patient identified problems/goals with staff: Yes. Medical problems stabilized or resolved: Yes. Denies suicidal/homicidal ideation: Yes. Issues/concerns per patient self-inventory: No. Other:   New problem(s) identified: No, Describe:  None  New  Short Term/Long Term Goal(s): Medication stabilization, elimination of SI thoughts, and development of a comprehensive mental wellness plan.   Patient Goals:    Discharge Plan or Barriers: CSW will continue to follow up for appropriate referrals and possible discharge planning.  Reason for Continuation of Hospitalization: Aggression Mania Medication stabilization  Estimated Length of Stay: 2-3 days  Attendees: Patient: 01/25/2019  Physician: Dr. Johnn Hai, MD 01/25/2019  Nursing: Benjamine Mola, RN 01/25/2019   RN Care Manager: 01/25/2019   Social Worker: Ardelle Anton, LCSW 01/25/2019   Recreational Therapist:  01/25/2019   Other:  01/25/2019   Other:  01/25/2019   Other: 01/25/2019      Scribe for Treatment Team: Trecia Rogers, LCSW 01/25/2019 1:18 PM

## 2019-01-26 MED ORDER — DIVALPROEX SODIUM 250 MG PO DR TAB
250.0000 mg | DELAYED_RELEASE_TABLET | Freq: Three times a day (TID) | ORAL | Status: DC
  Administered 2019-01-27 – 2019-01-28 (×2): 250 mg via ORAL
  Filled 2019-01-26 (×12): qty 1

## 2019-01-26 MED ORDER — DIVALPROEX SODIUM 125 MG PO CSDR
250.0000 mg | DELAYED_RELEASE_CAPSULE | Freq: Three times a day (TID) | ORAL | Status: DC
  Filled 2019-01-26 (×3): qty 2

## 2019-01-26 NOTE — Progress Notes (Signed)
Nursing Progress Note: 7p-7a D: Pt currently presents with a pleasant/anxious affect and behavior. Interacting intrusively with the milieu. Pt reports good sleep during the previous night with current medication regimen.  A: Pt refused medications per providers orders. Pt's labs and vitals were monitored throughout the night. Pt supported emotionally and encouraged to express concerns and questions. Pt educated on medications.  R: Pt's safety ensured with 15 minute and environmental checks. Pt currently denies SI, HI, and AVH. Pt verbally contracts to seek staff if SI,HI, or AVH occurs and to consult with staff before acting on any harmful thoughts. Will continue to monitor.    [place] NOVEL CORONAVIRUS (COVID-19) DAILY CHECK-OFF SYMPTOMS - answer yes or no to each - every day NO YES  Have you had a fever in the past 24 hours?  . Fever (Temp > 37.80C / 100F) X   Have you had any of these symptoms in the past 24 hours? . New Cough .  Sore Throat  .  Shortness of Breath .  Difficulty Breathing .  Unexplained Body Aches   X   Have you had any one of these symptoms in the past 24 hours not related to allergies?   . Runny Nose .  Nasal Congestion .  Sneezing   X   If you have had runny nose, nasal congestion, sneezing in the past 24 hours, has it worsened?  X   EXPOSURES - check yes or no X   Have you traveled outside the state in the past 14 days?  X   Have you been in contact with someone with a confirmed diagnosis of COVID-19 or PUI in the past 14 days without wearing appropriate PPE?  X   Have you been living in the same home as a person with confirmed diagnosis of COVID-19 or a PUI (household contact)?    X   Have you been diagnosed with COVID-19?    X              What to do next: Answered NO to all: Answered YES to anything:   Proceed with unit schedule Follow the [facility] Inpatient Flowsheet.

## 2019-01-26 NOTE — [facility] Suicide Risk Assessment (Signed)
[place] INPATIENT:  Family/Significant Other Suicide Prevention Education  Suicide Prevention Education:  Education Completed; Pt's uncle, Daniel Mcguire, has been identified by the patient as the family member/significant other with whom the patient will be residing, and identified as the person(s) who will aid the patient in the event of a mental health crisis (suicidal ideations/suicide attempt).  With written consent from the patient, the family member/significant other has been provided the following suicide prevention education, prior to the and/or following the discharge of the patient.  The suicide prevention education provided includes the following:  Suicide risk factors  Suicide prevention and interventions  National Suicide Hotline telephone number  [facility] assessment telephone number  [facility] Emergency Assistance [place] and/or Residential Mobile Crisis Unit telephone number  Request made of family/significant other to:  Remove weapons (e.g., guns, rifles, knives), all items previously/currently identified as safety concern.    Remove drugs/medications (over-the-counter, prescriptions, illicit drugs), all items previously/currently identified as a safety concern.  The family member/significant other verbalizes understanding of the suicide prevention education information provided.  The family member/significant other agrees to remove the items of safety concern listed above.  CSW contacted pt's uncle, Daniel Mcguire. Pt's uncle stated that he is not his uncle but might as well be. Pt's uncle stated that the pt needs to stay in [place], [place]. Pt's uncle stated that the pt has talked to him but he is not sure what is the truth. Pt's uncle states that the pt has a lot of issues. He stated that he does not want the pt to come back up to [place] because nobody cannot control him. He states that the pt doesn't respect his mother. Pt's mother is very  helpful, per the uncle. Pt's uncle states that the pt is very disrespectful towards his mother. Pt's uncle states that if he comes to [place] that he feels that he will get killed by people in [place] due to his behaviors because "he is very hot headed" and nobody can control him. Pt's uncle stated that he is also scared for him to come up because he doesn't want the pt to come to [place], get in his fight with his mother, and end up killing her. Pt's uncle states that if he doesn't want anyone to talk to his mother then that is a sign that something is up. Pt's father states that the pt did not grow up with a father. Pt's father never wanted kids and moved to [place], [place]. Pt's uncle states that the pt has issues with not having his father around. Pt's uncle stated that he is the nephew of famous Nurse, adult, Vladislav Axelson.   Daniel Mcguire 01/26/2019, 3:45 PM

## 2019-01-26 NOTE — Progress Notes (Signed)
[facility]  01/26/2019 12:09 PM Daniel Mcguire  MRN:  [id number] Subjective:  Patient remains argumentative refusing medication and agitating other patients.  He does not have specific thoughts of harming self or others but again is quite agitating to the milieu Principal Problem: Untreated bipolar disorder, mother reports these problems started and last year of high school Diagnosis: Active Problems:   Schizophrenia, paranoid (HCC)  Total Time spent with patient: 20 minutes  Past Medical History: History reviewed. No pertinent past medical history. History reviewed. No pertinent surgical history. Family History: History reviewed. No pertinent family history.  Social History:  Social History   Substance and Sexual Activity  Alcohol Use Yes     Social History   Substance and Sexual Activity  Drug Use Yes  . Types: Marijuana   Comment: daily    Social History   Socioeconomic History  . Marital status: Single    Spouse name: Not on file  . Number of children: Not on file  . Years of education: Not on file  . Highest education level: Not on file  Occupational History  . Not on file  Social Needs  . Financial resource strain: Not hard at all  . Food insecurity    Worry: Never true    Inability: Never true  . Transportation needs    Medical: No    Non-medical: No  Tobacco Use  . Smoking status: Never Smoker  . Smokeless tobacco: Never Used  Substance and Sexual Activity  . Alcohol use: Yes  . Drug use: Yes    Types: Marijuana    Comment: daily  . Sexual activity: Yes  Lifestyle  . Physical activity    Days per week: 4 days    Minutes per session: 80 min  . Stress: Not at all  Relationships  . Social Musicianconnections    Talks on phone: Three times a week    Gets together: More than three times a week    Attends religious service: More than 4 times per year    Active member of club or organization: No    Attends meetings of clubs or organizations: Never     Relationship status: Never married  Other Topics Concern  . Not on file  Social History Narrative  . Not on file   Additional Social History:    Pain Medications: n/a History of alcohol / drug use?: No history of alcohol / drug abuse Name of Substance 1: pot 1 - Age of First Use: 28yo                  Sleep: Fair  Appetite:  Fair  Current Medications: Current Facility-Administered Medications  Medication Dose Route Frequency Provider Last Rate Last Dose  . acetaminophen (TYLENOL) tablet 650 mg  650 mg Oral Q6H PRN Laveda AbbeParks, Laurie Britton, NP      . alum & mag hydroxide-simeth (MAALOX/MYLANTA) 200-200-20 MG/5ML suspension 30 mL  30 mL Oral Q4H PRN Laveda AbbeParks, Laurie Britton, NP      . diphenhydrAMINE (BENADRYL) capsule 50 mg  50 mg Oral Q6H PRN Malvin JohnsFarah, Anthoney Sheppard, MD       Or  . diphenhydrAMINE (BENADRYL) injection 50 mg  50 mg Intramuscular Q6H PRN Malvin JohnsFarah, Gayleen Sholtz, MD   50 mg at 01/25/19 1247  . diphenhydrAMINE (BENADRYL) capsule 50 mg  50 mg Oral TID Malvin JohnsFarah, Aliany Fiorenza, MD   50 mg at 01/25/19 1206  . hydrOXYzine (ATARAX/VISTARIL) tablet 25 mg  25 mg Oral TID PRN Arville CareParks,  Billey Chang, NP   25 mg at 01/23/19 0055  . ibuprofen (ADVIL) tablet 600 mg  600 mg Oral Q6H PRN Ethelene Hal, NP   600 mg at 01/25/19 1952  . LORazepam (ATIVAN) injection 4 mg  4 mg Intramuscular Q6H PRN Johnn Hai, MD   4 mg at 01/25/19 1247  . magnesium hydroxide (MILK OF MAGNESIA) suspension 30 mL  30 mL Oral Daily PRN Ethelene Hal, NP      . OLANZapine zydis (ZYPREXA) disintegrating tablet 15 mg  15 mg Oral BID Johnn Hai, MD   15 mg at 01/25/19 1206  . traZODone (DESYREL) tablet 50 mg  50 mg Oral QHS PRN Ethelene Hal, NP   50 mg at 01/23/19 2323  . ziprasidone (GEODON) injection 20 mg  20 mg Intramuscular Q6H PRN Johnn Hai, MD        Lab Results: No results found for this or any previous visit (from the past 48 hour(s)).  Blood Alcohol level:  Lab Results  Component Value Date    ETH <10 29/52/8413    Metabolic Disorder Labs: No results found for: HGBA1C, MPG No results found for: PROLACTIN No results found for: CHOL, TRIG, HDL, CHOLHDL, VLDL, LDLCALC  Physical Findings: AIMS: Facial and Oral Movements Muscles of Facial Expression: None, normal Lips and Perioral Area: None, normal Jaw: None, normal Tongue: None, normal,Extremity Movements Upper (arms, wrists, hands, fingers): None, normal Lower (legs, knees, ankles, toes): None, normal, Trunk Movements Neck, shoulders, hips: None, normal, Overall Severity Severity of abnormal movements (highest score from questions above): None, normal Incapacitation due to abnormal movements: None, normal Patient's awareness of abnormal movements (rate only patient's report): No Awareness, Dental Status Current problems with teeth and/or dentures?: No Does patient usually wear dentures?: No  CIWA:  CIWA-Ar Total: 4 COWS:  COWS Total Score: 0  Musculoskeletal: Strength & Muscle Tone: within normal limits Gait & Station: normal Patient leans: N/A  Psychiatric Specialty Exam: Physical Exam  ROS  Blood pressure (!) 171/106, pulse 68, temperature (!) 97.2 F (36.2 C), resp. rate 18, height 5\' 10"  (1.778 m), weight 95 kg, SpO2 100 %.Body mass index is 30.06 kg/m.  General Appearance: Casual  Eye Contact:  Good  Speech:  Clear and Coherent  Volume:  Increased  Mood:  Manic  Affect:  Congruent and Labile  Thought Process:  Irrelevant and Descriptions of Associations: Loose  Orientation:  Full (Time, Place, and Person)  Thought Content:  Paranoid Ideation  Suicidal Thoughts:  No  Homicidal Thoughts:  No  Memory:  Recent;   Poor  Judgement:  Poor  Insight:  Shallow  Psychomotor Activity:  nl  Concentration:  Concentration: Poor  Recall:  Poor  Fund of Knowledge:  Poor  Language:  Fair  Akathisia:  Negative  Handed:  Right  AIMS (if indicated):     Assets:  Resilience Social Support  ADL's:  Intact   Cognition:  WNL  Sleep:  Number of Hours: 6.75     Treatment Plan Summary: Daily contact with patient to assess and evaluate symptoms and progress in treatment, Medication management and Plan Mother phone she was very argumentative stating that the staff had told him information she had shared something like that I had to continually redirect her and get rather firm and tell her that this argumentative hostility towards me is not helping his case and that we need to focus on getting him better at any rate she did calm down somewhat stating  that he has had these difficulty since senior level of high school at any rate she also is told to encourage him to be compliant with meds we discussed in team possibility of secluding him however administration states due to lack of violence that will not be possible [facility]  Malvin JohnsFARAH,Mcarthur Ivins, MD 01/26/2019, 12:09 PM

## 2019-01-26 NOTE — Progress Notes (Signed)
DAR NOTE: Patient presents with anxious affect and irritable mood.  Denies suicidal thoughts, pain, auditory and visual hallucinations.  Continues to be intrusive and disruptive in milieu.  Ruminating and preoccupied with his bill of right/court.  Described energy level as normal and concentration as good.  Rates depression at 0, hopelessness at 0, and anxiety at 0.  Maintained on routine safety checks.  Refused all prescribed medications after several encouragements and attempts.  MD made aware.  Attended group and participated.  Patient observed socializing with peers in the dayroom.  Patient is safe on and off the unit.

## 2019-01-27 MED ORDER — ARIPIPRAZOLE ER 400 MG IM SRER
400.0000 mg | INTRAMUSCULAR | Status: DC
  Administered 2019-01-27: 400 mg via INTRAMUSCULAR

## 2019-01-27 NOTE — Plan of Care (Signed)
D: Patient is alert, pleasant, and cooperative. Denies SI, HI, AVH, and verbally contracts for safety. Reports anxiety. Patient denies physical symptoms/pain.    A: Medications administered per MD order. Support provided. Patient educated on safety on the unit and medications. Routine safety checks every 15 minutes. Patient stated understanding to tell nurse about any new physical symptoms. Patient understands to tell staff of any needs.     R: No adverse drug reactions noted. Patient verbally contracts for safety. Patient remains safe at this time and will continue to monitor.   Problem: Health Behavior/Discharge Planning: Goal: Compliance with prescribed medication regimen will improve Outcome: Progressing   Problem: Safety: Goal: Ability to remain free from injury will improve Outcome: Progressing   Patient took medication as prescribed this evening. Patient remains safe and will continue to monitor.   [place] NOVEL CORONAVIRUS (COVID-19) DAILY CHECK-OFF SYMPTOMS - answer yes or no to each - every day NO YES  Have you had a fever in the past 24 hours?  Fever (Temp > 37.80C / 100F) X   Have you had any of these symptoms in the past 24 hours? New Cough  Sore Throat   Shortness of Breath  Difficulty Breathing  Unexplained Body Aches   X   Have you had any one of these symptoms in the past 24 hours not related to allergies?   Runny Nose  Nasal Congestion  Sneezing   X   If you have had runny nose, nasal congestion, sneezing in the past 24 hours, has it worsened?  X   EXPOSURES - check yes or no X   Have you traveled outside the state in the past 14 days?  X   Have you been in contact with someone with a confirmed diagnosis of COVID-19 or PUI in the past 14 days without wearing appropriate PPE?  X   Have you been living in the same home as a person with confirmed diagnosis of COVID-19 or a PUI (household contact)?    X   Have you been diagnosed with COVID-19?    X               What to do next: Answered NO to all: Answered YES to anything:   Proceed with unit schedule Follow the [facility] Inpatient Flowsheet.

## 2019-01-27 NOTE — Progress Notes (Signed)
[facility]  01/27/2019 10:51 AM Daniel FairyBrandon Mcguire  MRN:  [id number] Subjective:   Patient continues to refuse all medication, he is no longer agitating other patients so this is a positive however when I discussed the treatment plan with him he laughs and says "you are messing with me" and refuses to accept that these are genuine discussions.  Family is not willing to take him home out of state given his instability.  Patient remains nonviolent therefore forced medications are not supported at this point in time.  However, we did discuss long-acting injectable prior to possible discharge and he agrees at this point at least temporarily. Principal Problem: bipolar manic with psychosis and volatility complicated by chronic and acute noncompliance and cannabis dependency Diagnosis: Active Problems:   Schizophrenia, paranoid (HCC)  Total Time spent with patient: 20 minutes    Past Medical History: History reviewed. No pertinent past medical history. History reviewed. No pertinent surgical history. Family History: History reviewed. No pertinent family history. Family Psy Social History:  Social History   Substance and Sexual Activity  Alcohol Use Yes     Social History   Substance and Sexual Activity  Drug Use Yes  . Types: Marijuana   Comment: daily    Social History   Socioeconomic History  . Marital status: Single    Spouse name: Not on file  . Number of children: Not on file  . Years of education: Not on file  . Highest education level: Not on file  Occupational History  . Not on file  Social Needs  . Financial resource strain: Not hard at all  . Food insecurity    Worry: Never true    Inability: Never true  . Transportation needs    Medical: No    Non-medical: No  Tobacco Use  . Smoking status: Never Smoker  . Smokeless tobacco: Never Used  Substance and Sexual Activity  . Alcohol use: Yes  . Drug use: Yes    Types: Marijuana    Comment: daily  . Sexual  activity: Yes  Lifestyle  . Physical activity    Days per week: 4 days    Minutes per session: 80 min  . Stress: Not at all  Relationships  . Social Musicianconnections    Talks on phone: Three times a week    Gets together: More than three times a week    Attends religious service: More than 4 times per year    Active member of club or organization: No    Attends meetings of clubs or organizations: Never    Relationship status: Never married  Other Topics Concern  . Not on file  Social History Narrative  . Not on file   Additional Social History:    Pain Medications: n/a History of alcohol / drug use?: No history of alcohol / drug abuse Name of Substance 1: pot 1 - Age of First Use: 28yo                  Sleep: Fair  Appetite:  Fair  Current Medications: Current Facility-Administered Medications  Medication Dose Route Frequency Provider Last Rate Last Dose  . acetaminophen (TYLENOL) tablet 650 mg  650 mg Oral Q6H PRN Laveda AbbeParks, Laurie Britton, NP      . alum & mag hydroxide-simeth (MAALOX/MYLANTA) 200-200-20 MG/5ML suspension 30 mL  30 mL Oral Q4H PRN Laveda AbbeParks, Laurie Britton, NP      . ARIPiprazole ER (ABILIFY MAINTENA) injection 400 mg  400  mg Intramuscular Q28 days Johnn Hai, MD      . diphenhydrAMINE (BENADRYL) capsule 50 mg  50 mg Oral Q6H PRN Johnn Hai, MD       Or  . diphenhydrAMINE (BENADRYL) injection 50 mg  50 mg Intramuscular Q6H PRN Johnn Hai, MD   50 mg at 01/25/19 1247  . diphenhydrAMINE (BENADRYL) capsule 50 mg  50 mg Oral TID Johnn Hai, MD   50 mg at 01/25/19 1206  . divalproex (DEPAKOTE) DR tablet 250 mg  250 mg Oral Q8H Johnn Hai, MD      . hydrOXYzine (ATARAX/VISTARIL) tablet 25 mg  25 mg Oral TID PRN Ethelene Hal, NP   25 mg at 01/23/19 0055  . ibuprofen (ADVIL) tablet 600 mg  600 mg Oral Q6H PRN Ethelene Hal, NP   600 mg at 01/25/19 1952  . LORazepam (ATIVAN) injection 4 mg  4 mg Intramuscular Q6H PRN Johnn Hai, MD   4 mg  at 01/25/19 1247  . magnesium hydroxide (MILK OF MAGNESIA) suspension 30 mL  30 mL Oral Daily PRN Ethelene Hal, NP      . OLANZapine zydis (ZYPREXA) disintegrating tablet 15 mg  15 mg Oral BID Johnn Hai, MD   15 mg at 01/25/19 1206  . traZODone (DESYREL) tablet 50 mg  50 mg Oral QHS PRN Ethelene Hal, NP   50 mg at 01/23/19 2323  . ziprasidone (GEODON) injection 20 mg  20 mg Intramuscular Q6H PRN Johnn Hai, MD        Lab Results: No results found for this or any previous visit (from the past 48 hour(s)).  Blood Alcohol level:  Lab Results  Component Value Date   ETH <10 41/74/0814    Metabolic Disorder Labs: No results found for: HGBA1C, MPG No results found for: PROLACTIN No results found for: CHOL, TRIG, HDL, CHOLHDL, VLDL, LDLCALC  Physical Findings: AIMS: Facial and Oral Movements Muscles of Facial Expression: None, normal Lips and Perioral Area: None, normal Jaw: None, normal Tongue: None, normal,Extremity Movements Upper (arms, wrists, hands, fingers): None, normal Lower (legs, knees, ankles, toes): None, normal, Trunk Movements Neck, shoulders, hips: None, normal, Overall Severity Severity of abnormal movements (highest score from questions above): None, normal Incapacitation due to abnormal movements: None, normal Patient's awareness of abnormal movements (rate only patient's report): No Awareness, Dental Status Current problems with teeth and/or dentures?: No Does patient usually wear dentures?: No  CIWA:  CIWA-Ar Total: 4 COWS:  COWS Total Score: 0  Musculoskeletal: Strength & Muscle Tone: within normal limits Gait & Station: normal Patient leans: N/A  Psychiatric Specialty Exam: Physical Exam  ROS  Blood pressure 133/71, pulse (!) 103, temperature 98 F (36.7 C), resp. rate 18, height 5\' 10"  (1.778 m), weight 95 kg, SpO2 100 %.Body mass index is 30.06 kg/m.  General Appearance: Casual  Eye Contact:  Minimal  Speech:  Clear and  Coherent  Volume:  Increased  Mood:  hypomanic  Affect:  Constricted  Thought Process:  Linear and Descriptions of Associations: Loose  Orientation:  Full (Time, Place, and Person)  Thought Content:  Illogical and Paranoid Ideation  Suicidal Thoughts:  No  Homicidal Thoughts:  No  Memory:  Recent;   Poor  Judgement:  Impaired  Insight:  Lacking  Psychomotor Activity:  nl  Concentration:  Concentration: Fair  Recall:  Poor  Fund of Knowledge:  Good  Language:  Good  Akathisia:  Negative  Handed:  Right  AIMS (if  indicated):     Assets:  Physical Health Resilience  ADL's:  Intact  Cognition:  WNL  Sleep:  Number of Hours: 6.75     Treatment Plan Summary: Daily contact with patient to assess and evaluate symptoms and progress in treatment and Medication management patient states he no longer wants anyone speaking with his mother she was rather hostile in conversation-at any rate the patient himself does agree at this point in time to long-acting injectable aripiprazole with past exposure and no sensitivity reported.  Probable discharge by the end of the week if complies  Malvin JohnsFARAH,Melquan Ernsberger, MD 01/27/2019, 10:51 AM

## 2019-01-27 NOTE — Progress Notes (Signed)
Patient ID: Daniel Mcguire, male   DOB: 07/21/1991, 28 y.o.   MRN: [id number]   Patient asked to speak with CSW. CSW met with pt. Pt stated that he feels like the doctor is racist due to his belief that the doctor gives the "white patients vitamins and shoot up the black patients". Patient asked about the "shot" that the doctor has ordered him to take so that he can go home. CSW asked the pt to talk to his nurse about the injection. Pt voiced concerned about his 2nd amendment rights being violated because he believes if he has a diagnosis then he will not be able to keep his gun. CSW gave pt the phone number to the [employer] department to ask about gun ownership. Pt also mentioned that he is not going to be allowed back to his apartment and he is currently now homeless. Patient asked for shelter resources.

## 2019-01-28 MED ORDER — OLANZAPINE 15 MG PO TBDP: 15.0000 mg | ORAL_TABLET | Freq: Every day | ORAL | 1 refills | Status: DC

## 2019-01-28 MED ORDER — DIVALPROEX SODIUM 500 MG PO DR TAB: 500.0000 mg | DELAYED_RELEASE_TABLET | Freq: Every day | ORAL | 1 refills | Status: AC

## 2019-01-28 MED ORDER — OLANZAPINE 5 MG PO TBDP: 15.0000 mg | ORAL_TABLET | Freq: Every day | ORAL | Status: DC

## 2019-01-28 MED ORDER — DIVALPROEX SODIUM 500 MG PO DR TAB: 500.0000 mg | DELAYED_RELEASE_TABLET | Freq: Every day | ORAL | Status: DC

## 2019-01-28 NOTE — [facility] Suicide Risk Assessment (Signed)
[facility] Discharge Suicide Risk Assessment   Principal Problem: Bipolar exacerbation  discharge Diagnoses: Active Problems:   Schizophrenia, paranoid ([place])   Total Time spent with patient: 45 minutes  Musculoskeletal: Strength & Muscle Tone: within normal limits Gait & Station: normal Patient leans: N/A  Psychiatric Specialty Exam: ROS  Blood pressure 133/71, pulse (!) 103, temperature 98 F (36.7 C), resp. rate 18, height 5\' 10"  (1.778 m), weight 95 kg, SpO2 100 %.Body mass index is 30.06 kg/m.  General Appearance: Casual  Eye Contact::  Good  Speech:  Clear and Coherent409  Volume:  Normal  Mood:  Irritable  Affect:  Restricted  Thought Process:  Irrelevant and Descriptions of Associations: Tangential  Orientation:  Full (Time, Place, and Person)  Thought Content:  Tangential  Suicidal Thoughts:  No  Homicidal Thoughts:  No  Memory:  Immediate;   Fair  Judgement:  Fair  Insight:  Fair  Psychomotor Activity:  Normal  Concentration:  Fair  Recall:  [employer] of Knowledge:Fair  Language: Fair  Akathisia:  Negative  Handed:  Right  AIMS (if indicated):     Assets:  Communication Skills Housing Leisure Time Physical Health  Sleep:  Number of Hours: 6.75  Cognition: WNL  ADL's:  Intact   Mental Status Per Nursing Assessment::   On Admission:  NA  Demographic Factors:  Male  Loss Factors: NA  Historical Factors: NA  Risk Reduction Factors:   Religious beliefs about death  Continued Clinical Symptoms:  Bipolar Disorder:   Mixed State  Cognitive Features That Contribute To Risk:  Polarized thinking    Suicide Risk:  Minimal: No identifiable suicidal ideation.  Patients presenting with no risk factors but with morbid ruminations; may be classified as minimal risk based on the severity of the depressive symptoms  Follow-up Information    Patient declines follow-up Follow up.   Why: Patient states he is planning to relocate immediately to his mother's  home in [place].  He does not want follow-up to be arranged.          Plan Of Care/Follow-up recommendations:  Activity:  full  Albena Comes, MD 01/28/2019, 12:17 PM

## 2019-01-28 NOTE — Progress Notes (Signed)
Did not attend group 

## 2019-01-28 NOTE — Progress Notes (Addendum)
  [facility] Adult Case Management Discharge Plan :  Will you be returning to the same living situation after discharge:  No.; pt is going to a homeless shelter due to losing his apartment  At discharge, do you have transportation home?: Yes,  [employer] for 12:30pm Do you have the ability to pay for your medications: No.; No insruance  Release of information consent forms completed and in the chart;  Patient's signature needed at discharge.  Patient to Follow up at: Follow-up Information    Patient declines follow-up Follow up.   Why: Patient states he is planning to relocate immediately to his mother's home in [place].  He does not want follow-up to be arranged.          Next level of care provider has access to [place] and Suicide Prevention discussed: Yes,  pt's uncle  Have you used any form of tobacco in the last 30 days? (Cigarettes, Smokeless Tobacco, Cigars, and/or Pipes): No  Has patient been referred to the Quitline?: N/A patient is not a smoker  Patient has been referred for addiction treatment: Pt. refused referral  Trecia Rogers, LCSW 01/28/2019, 10:25 AM

## 2019-01-28 NOTE — Progress Notes (Signed)
Patient ID: Daniel Mcguire, male   DOB: 02/21/1991, 28 y.o.   MRN: [id number]   CSW tried contacting [facility] multiple times. Per email sent out on 01/27/2019 from Tonette Bihari, the [facility] shelter had availability. CSW let the patient know that the CSW was unsuccessful in talking with anyone from the shelter. CSW let patient know that CSW can send the patient with a letter and his negative results for COVID-19 testing. Patient stated that he will still go to the shelter and will discharge to [facility] shelter due to not being able to go back to his apartment.
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
## 2019-01-28 NOTE — Progress Notes (Signed)
Patient ID: Daniel Mcguire, male   DOB: 04-01-1991, 28 y.o.   MRN: [id number]  Patient denies SI, HI and AVH upon discharge.  Patient denies any follow up care and refused to take any medications or prescriptions with him.  Patient discharged on his own accord.

## 2019-01-31 ENCOUNTER — Encounter (HOSPITAL_COMMUNITY): Payer: Self-pay

## 2019-01-31 ENCOUNTER — Emergency Department (HOSPITAL_COMMUNITY): Payer: Medicaid Other

## 2019-01-31 ENCOUNTER — Other Ambulatory Visit: Payer: Self-pay

## 2019-01-31 ENCOUNTER — Emergency Department (HOSPITAL_COMMUNITY)
Admission: EM | Admit: 2019-01-31 | Discharge: 2019-02-02 | Disposition: A | Discharge status: Home / Self Care | Payer: Self-pay | Attending: Emergency Medicine | Admitting: Emergency Medicine

## 2019-01-31 DIAGNOSIS — F319 Bipolar disorder, unspecified: Secondary | ICD-10-CM | POA: Insufficient documentation

## 2019-01-31 DIAGNOSIS — F1220 Cannabis dependence, uncomplicated: Secondary | ICD-10-CM | POA: Insufficient documentation

## 2019-01-31 DIAGNOSIS — F200 Paranoid schizophrenia: Secondary | ICD-10-CM | POA: Insufficient documentation

## 2019-01-31 LAB — COMPREHENSIVE METABOLIC PANEL
ALT: 21 U/L (ref 0–44)
AST: 25 U/L (ref 15–41)
Albumin: 4.1 g/dL (ref 3.5–5.0)
Alkaline Phosphatase: 56 U/L (ref 38–126)
Anion gap: 12 (ref 5–15)
BUN: 17 mg/dL (ref 6–20)
CO2: 23 mmol/L (ref 22–32)
Calcium: 9.3 mg/dL (ref 8.9–10.3)
Chloride: 106 mmol/L (ref 98–111)
Creatinine, Ser: 1.02 mg/dL (ref 0.61–1.24)
GFR calc Af Amer: >60 mL/min (ref >60)
GFR calc non Af Amer: >60 mL/min (ref >60)
Glucose, Bld: 109 mg/dL — ABNORMAL HIGH (ref 70–99)
Potassium: 4.5 mmol/L (ref 3.5–5.1)
Sodium: 141 mmol/L (ref 135–145)
Total Bilirubin: 0.5 mg/dL (ref 0.3–1.2)
Total Protein: 7 g/dL (ref 6.5–8.1)

## 2019-01-31 LAB — CBC WITH DIFFERENTIAL/PLATELET
Abs Immature Granulocytes: 0.02 10*3/uL (ref 0.00–0.07)
Basophils Absolute: 0 10*3/uL (ref 0.0–0.1)
Basophils Relative: 1 %
Eosinophils Absolute: 0.1 10*3/uL (ref 0.0–0.5)
Eosinophils Relative: 3 %
HCT: 41.1 % (ref 39.0–52.0)
Hemoglobin: 12.8 g/dL — ABNORMAL LOW (ref 13.0–17.0)
Immature Granulocytes: 0 %
Lymphocytes Relative: 26 %
Lymphs Abs: 1.4 10*3/uL (ref 0.7–4.0)
MCH: 29.8 pg (ref 26.0–34.0)
MCHC: 31.1 g/dL (ref 30.0–36.0)
MCV: 95.6 fL (ref 80.0–100.0)
Monocytes Absolute: 0.6 10*3/uL (ref 0.1–1.0)
Monocytes Relative: 10 %
Neutro Abs: 3.3 10*3/uL (ref 1.7–7.7)
Neutrophils Relative %: 60 %
Platelets: 222 10*3/uL (ref 150–400)
RBC: 4.3 MIL/uL (ref 4.22–5.81)
RDW: 11.7 % (ref 11.5–15.5)
WBC: 5.5 10*3/uL (ref 4.0–10.5)
nRBC: 0 % (ref 0.0–0.2)

## 2019-01-31 LAB — TROPONIN I (HIGH SENSITIVITY)
Troponin I (High Sensitivity): <2 ng/L (ref <18)
Troponin I (High Sensitivity): <2 ng/L (ref <18)

## 2019-01-31 LAB — D-DIMER, QUANTITATIVE: D-Dimer, Quant: 0.32 ug/mL-FEU (ref 0.00–0.50)

## 2019-01-31 LAB — VALPROIC ACID LEVEL: Valproic Acid Lvl: <10 ug/mL — ABNORMAL LOW (ref 50.0–100.0)

## 2019-01-31 MED ORDER — DIVALPROEX SODIUM 500 MG PO DR TAB
500.0000 mg | DELAYED_RELEASE_TABLET | Freq: Every day | ORAL | Status: DC
  Administered 2019-01-31: 500 mg via ORAL
  Filled 2019-01-31 (×2): qty 1

## 2019-01-31 MED ORDER — OLANZAPINE 5 MG PO TBDP
15.0000 mg | ORAL_TABLET | Freq: Every day | ORAL | Status: DC
  Administered 2019-01-31: 15 mg via ORAL
  Filled 2019-01-31: qty 1

## 2019-01-31 NOTE — ED Notes (Signed)
Patient asked for some information on abilify.  Nurse printed out some information sheets for him.

## 2019-01-31 NOTE — ED Notes (Signed)
Pt states that he thinks he might need an inhaler. MD made aware. Will await orders.

## 2019-01-31 NOTE — Progress Notes (Addendum)
[facility] Psych ED Progress Note Patient is reassessed with Dr. Darleene Cleaver. At this time he continues to rest comfortably and is cooperative with unit policies and medication recommendations. He denies any suicidal thoughts, homicidal thoughts, hallucinations, depression, anxiety, sadness, worthless at this time. He was recently discharge three days and did not follow up with his appointments at the hospital. We have restarted his home medications. Will continue to monitor for observation and discharge tomorrow. Will instruct patient to keep all outpatient appointments and follow up as directed.   Patient seen face-to-face for psychiatric evaluation, chart reviewed and case discussed with the physician extender and developed treatment plan. Reviewed the information documented and agree with the treatment plan. Corena Pilgrim, MD

## 2019-01-31 NOTE — ED Notes (Signed)
Pt A&O x 3, no distress noted, calm & cooperative, presents for evaluation after receiving a Haldol Decanoate injection last Thursday.  Pt reports he had a bad reaction to injection.  Denies SI, HI or AVH. Monitoring for safety.  Resting at present.
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
## 2019-01-31 NOTE — ED Notes (Signed)
Breakfast tray given. °
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
## 2019-01-31 NOTE — ED Notes (Signed)
Patient coming up to nurse's station asking about his medications and why he is receiving those medications.  Denies that he has any psychiatric history.  Asked nurse to tell him which psychiatric history was listed and nurse told him paranoid schizophrenia.  Patient asked that he have another doctor if he is admitted to [facility].

## 2019-01-31 NOTE — ED Triage Notes (Signed)
Pt reports feeling anxious and paranoid after smoking marijuana around midnight. He states that he was recently IVC'd and went to [facility]. He states that they gave him a shot that is supposed to last 28 days and he feels that it is causing his anxiety. A&Ox4.

## 2019-01-31 NOTE — ED Provider Notes (Signed)
[facility]-EMERGENCY DEPT Provider Note   CSN: [id number] Arrival date & time: 01/31/19  0056     History   Chief Complaint Chief Complaint  Patient presents with  . Paranoid    HPI Daniel Mcguire is a 28 y.o. male.     Patient states he is feeling anxious and "too mellow".  States symptoms started yesterday after smoking some marijuana around midnight.  He was recently in behavioral health hospital and was discharged 3 days ago.  States he is concerned he is having an adverse reaction to the medication he was given.  Chart review shows that he refused to accept his diagnosis of schizophrenia and refusing to take his medications.  Patient states he does not normally have these symptoms when he smokes marijuana.  He says he feels tightness chest and anxious and hyperventilating.  He denies any suicidal thoughts or homicidal thoughts.  Denies hearing any voices.  Denies any leg pain or leg swelling.  States he is never felt this way before after smoking marijuana.  He is concerned that he is having adverse reaction to the injection he was given at behavioral health hospital.  The history is provided by the patient.    History reviewed. No pertinent past medical history.  Patient Active Problem List   Diagnosis Date Noted  . Acute psychosis (HCC) 01/22/2019  . Schizophrenia, paranoid (HCC) 01/22/2019    History reviewed. No pertinent surgical history.      Home Medications    Prior to Admission medications   Medication Sig Start Date End Date Taking? Authorizing Provider  divalproex (DEPAKOTE) 500 MG DR tablet Take 1 tablet (500 mg total) by mouth at bedtime. 01/28/19   Malvin JohnsFarah, Brian, MD  Multiple Vitamin (MULTIVITAMIN WITH MINERALS) TABS tablet Take 1 tablet by mouth daily.    [provider]  OLANZapine zydis (ZYPREXA) 15 MG disintegrating tablet Take 1 tablet (15 mg total) by mouth at bedtime. 01/28/19   Malvin JohnsFarah, Brian, MD  Omega-3 Fatty Acids (FISH  OIL) 1000 MG CAPS Take 1,000 mg by mouth daily.    [provider]    Family History History reviewed. No pertinent family history.  Social History Social History   Tobacco Use  . Smoking status: Never Smoker  . Smokeless tobacco: Never Used  Substance Use Topics  . Alcohol use: Yes  . Drug use: Yes    Types: Marijuana    Comment: daily     Allergies   Patient has no known allergies.   Review of Systems Review of Systems  Constitutional: Negative for activity change, appetite change and fever.  HENT: Negative for congestion and rhinorrhea.   Respiratory: Positive for chest tightness and shortness of breath.   Cardiovascular: Negative for chest pain.  Gastrointestinal: Negative for abdominal pain, nausea and vomiting.  Genitourinary: Negative for dysuria, hematuria and urgency.  Musculoskeletal: Negative for arthralgias and myalgias.  Skin: Negative for rash.  Neurological: Negative for weakness.  Psychiatric/Behavioral: Positive for decreased concentration. The patient is nervous/anxious and is hyperactive.    all other systems are negative except as noted in the HPI and PMH.     Physical Exam Updated Vital Signs BP (!) 146/84 (BP Location: Left Arm)   Pulse 100   Temp 98.7 F (37.1 C) (Oral)   Resp 16   Ht 5\' 10"  (1.778 m)   Wt 94.8 kg   SpO2 96%   BMI 29.99 kg/m   Physical Exam Vitals signs and nursing note reviewed.  Constitutional:      General: He is not in acute distress.    Appearance: He is well-developed. He is obese.     Comments: Flat affect  HENT:     Head: Normocephalic and atraumatic.     Mouth/Throat:     Pharynx: No oropharyngeal exudate.  Eyes:     Conjunctiva/sclera: Conjunctivae normal.     Pupils: Pupils are equal, round, and reactive to light.  Neck:     Musculoskeletal: Normal range of motion and neck supple.     Comments: No meningismus. Cardiovascular:     Rate and Rhythm: Normal rate and regular rhythm.     Heart  sounds: Normal heart sounds. No murmur.  Pulmonary:     Effort: Pulmonary effort is normal. No respiratory distress.     Breath sounds: Normal breath sounds.  Abdominal:     Palpations: Abdomen is soft.     Tenderness: There is no abdominal tenderness. There is no guarding or rebound.  Musculoskeletal: Normal range of motion.        General: No tenderness.  Skin:    General: Skin is warm.  Neurological:     Mental Status: He is alert and oriented to person, place, and time.     Cranial Nerves: No cranial nerve deficit.     Motor: No abnormal muscle tone.     Coordination: Coordination normal.     Comments: No ataxia on finger to nose bilaterally. No pronator drift. 5/5 strength throughout. CN 2-12 intact.Equal grip strength. Sensation intact.   Psychiatric:        Behavior: Behavior normal.      ED Treatments / Results  Labs (all labs ordered are listed, but only abnormal results are displayed) Labs Reviewed  CBC WITH DIFFERENTIAL/PLATELET - Abnormal; Notable for the following components:      Result Value   Hemoglobin 12.8 (*)    All other components within normal limits  COMPREHENSIVE METABOLIC PANEL - Abnormal; Notable for the following components:   Glucose, Bld 109 (*)    All other components within normal limits  VALPROIC ACID LEVEL - Abnormal; Notable for the following components:   Valproic Acid Lvl <10 (*)    All other components within normal limits  D-DIMER, QUANTITATIVE (NOT AT [facility])  TROPONIN I (HIGH SENSITIVITY)    EKG EKG Interpretation  Date/Time:  Sunday January 31 2019 03:06:31 EDT Ventricular Rate:  69 PR Interval:    QRS Duration: 73 QT Interval:  373 QTC Calculation: 400 R Axis:   77 Text Interpretation:  Sinus rhythm RSR' in V1 or V2, probably normal variant diffuse J point elevation similar to previous Confirmed by Glynn Octaveancour, Mellina Benison [phone]([id number]) on 01/31/2019 3:12:59 AM   Radiology Dg Chest 2 View  Result Date: 01/31/2019 CLINICAL DATA:  Shortness  of breath EXAM: CHEST - 2 VIEW COMPARISON:  None. FINDINGS: Heart size is within normal limits. Minimal lower thoracic vertebral wedging. No acute airspace disease or effusion. IMPRESSION: No active cardiopulmonary disease. Electronically Signed   By: Jasmine PangKim  Fujinaga M.D.   On: 01/31/2019 04:03    Procedures Procedures (including critical care time)  Medications Ordered in ED Medications - No data to display   Initial Impression / Assessment and Plan / ED Course  I have reviewed the triage vital signs and the nursing notes.  Pertinent labs & imaging results that were available during my care of the patient were reviewed by me and considered in my medical decision making (see chart for  details).       Patient with concern for adverse reaction to recent hospitalization at behavioral hospital.  Has been noncompliant with his medications.  Denies any suicidal or homicidal thoughts  He was given injectable aripiprazole on 7/8.  CXR negative. EKG with diffuse ST elevation similar to previous. Troponin and D-dimer negative.   Work-up reassuring.  Low suspicion for ACS or PE.  Patient still perseverating that he does not have bipolar disorder and refusing to take medications.  TTS consult obtained.  They recommend a.m. psych evaluation.  Holding orders are placed. Final Clinical Impressions(s) / ED Diagnoses   Final diagnoses:  Paranoid schizophrenia [facility])    ED Discharge Orders    None       Caroleen Stoermer, Annie Main, MD 01/31/19 [phone]

## 2019-01-31 NOTE — ED Notes (Signed)
Water was given .

## 2019-01-31 NOTE — ED Notes (Signed)
TTS at bedside. 

## 2019-01-31 NOTE — ED Notes (Addendum)
Pt stated that he feels like he is going to pass out and feels he is hyperventilating. Pt leaned back in the chair and reassured that his breathing is a normal rate.
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
## 2019-01-31 NOTE — ED Notes (Signed)
Lunch tray given. 

## 2019-02-01 ENCOUNTER — Encounter (HOSPITAL_COMMUNITY): Payer: Self-pay | Admitting: Registered Nurse

## 2019-02-01 DIAGNOSIS — F3164 Bipolar disorder, current episode mixed, severe, with psychotic features: Secondary | ICD-10-CM

## 2019-02-01 DIAGNOSIS — F319 Bipolar disorder, unspecified: Secondary | ICD-10-CM

## 2019-02-01 MED ORDER — OLANZAPINE 10 MG PO TBDP
10.0000 mg | ORAL_TABLET | Freq: Every day | ORAL | Status: DC
  Filled 2019-02-01: qty 1

## 2019-02-01 NOTE — Progress Notes (Signed)
Pt meets inpatient criteria per Dr. Leilani Merl. Referral information has been sent to the following hospitals for review:  [facility]  [facility]  [facility]  [facility]     Disposition will continue to follow for inpatient placement needs.   Audree Camel, LCSW, [place] Disposition [facility]/TTS [phone]

## 2019-02-01 NOTE — ED Notes (Signed)
Stretcher locked and in low position,Side rails upright. Call light given and within reach.  Comfort measures provided. °

## 2019-02-01 NOTE — ED Notes (Signed)
Pt is constantly up at nurses station asking questions.  Pt was asked to stay in room due to quarantine.  He is cooperative but comes back a few minutes later.    He has asked at least 5 times if he is still voluntary.
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
## 2019-02-01 NOTE — ED Notes (Signed)
Pt put mattress on the floor, claiming the bed is hurting his back but sits in the chair. Pt is constantly at nurses station.

## 2019-02-01 NOTE — BH Assessment (Signed)
[facility] Assessment Progress Note  Per Buford Dresser, DO this pt requires psychiatric hospitalization at this time.  Pt is currently under voluntary status, but he meets criteria for IVC at this time if necessary.  Placement will be sought for pt.  Jalene Mullet, [place] Coordinator [phone]

## 2019-02-02 MED ORDER — OLANZAPINE 10 MG PO TBDP: 10.0000 mg | ORAL_TABLET | Freq: Every day | ORAL | 0 refills | Status: AC

## 2019-02-02 MED ORDER — LIDOCAINE 5 % EX PTCH
1.0000 | MEDICATED_PATCH | CUTANEOUS | Status: DC
  Filled 2019-02-02: qty 1

## 2019-02-02 MED ORDER — DIPHENHYDRAMINE HCL 25 MG PO CAPS
25.0000 mg | ORAL_CAPSULE | Freq: Once | ORAL | Status: AC
  Administered 2019-02-02: 25 mg via ORAL
  Filled 2019-02-02: qty 1

## 2019-02-02 MED ORDER — IBUPROFEN 200 MG PO TABS
600.0000 mg | ORAL_TABLET | Freq: Once | ORAL | Status: AC
  Administered 2019-02-02: 600 mg via ORAL
  Filled 2019-02-02: qty 3

## 2019-02-02 NOTE — ED Notes (Signed)
Up to the desk requesting ibuprofen for rt hip/leg pain

## 2019-02-02 NOTE — ED Notes (Signed)
Pt alert/oriented/pleasnet/denies si/hi/avh at t his time.  Pt reports that he has been taking his meds.  NAD, procedures explained.

## 2019-02-02 NOTE — BH Assessment (Signed)
[facility] Assessment Progress Note  Per Buford Dresser, DO, this pt does not require psychiatric hospitalization at this time.  Pt is to be discharged from [facility] with recommendation to follow up with [facility].  This has been included in pt's discharge instructions.  Pt's nurse, Narda Rutherford, has been notified.  Jalene Mullet, Sarah Ann Triage Specialist [phone]

## 2019-02-02 NOTE — ED Notes (Signed)
Pt denies si/hi/avh on dc.  Written dc instructions and follow up reviewed with pt.  Pt encouraged to take his medications as directed and contact his OP provider for follow up as soon as possible.  Pt also encouraged to take his medication as directed.  Pt also encouraged to seek treatment/return for any changes or concerns. Pt verbalized understanding.  Pt ambulatory w/o difficultly to dc area, belongings returned after leaving the unit.

## 2019-02-02 NOTE — ED Notes (Signed)
Up walking in the hall 

## 2019-02-02 NOTE — Discharge Instructions (Signed)
For your mental health needs, you are advised to follow up with [facility].  Call them at your earliest opportunity to schedule an intake appointment: ° °     [facility] °     [address] °     [place], [place] [address] °     [phone] °     Crisis number: [phone] °

## 2019-02-02 NOTE — Consult Note (Addendum)
[facility]  02/02/2019 12:03 PM Daniel Mcguire Marter  MRN:  [id number] Principal Problem: Bipolar disorder [facility](HCC) Discharge Diagnoses: Principal Problem:   Bipolar disorder (HCC) Active Problems:   Schizophrenia, paranoid (HCC)   Subjective: Daniel Mcguire Can, 28 y.o., male patient seen via tele psych by this provider, Dr. Sharma CovertNorman; and chart reviewed on 02/02/19.  On evaluation Daniel Mcguire Mcnair reports he is feeling a lot better today.  Patient states that he rested well last night.  Patient states that he is still interested in seeing another psychiatrist for a second opinion related to his diagnosis.  Patient states that his mother is his support system.  Patient denies suicidal/self-harm/homicidal ideation, psychosis, and paranoia. During evaluation Daniel Mcguire Hable is alert/oriented x 4; calm/cooperative; and mood is congruent with affect.  He does not appear to be responding to internal/external stimuli or delusional thoughts.  Patient denies suicidal/self-harm/homicidal ideation, psychosis, and paranoia.  Patient answered questions appropriately.     Total Time spent with patient: 30 minutes  [facility] Bipolar disorder  Past Medical History: History reviewed. No pertinent past medical history. History reviewed. No pertinent surgical history. Family History: History reviewed. No pertinent family history. Family Psychiatric  History: Denies  Social History:  Social History   Substance and Sexual Activity  Alcohol Use Yes     Social History   Substance and Sexual Activity  Drug Use Yes  . Types: Marijuana   Comment: daily    Social History   Socioeconomic History  . Marital status: Single    Spouse name: Not on file  . Number of children: Not on file  . Years of education: Not on file  . Highest education level: Not on file  Occupational History  . Not on file  Social Needs  . Financial resource strain: Not hard at all  .  Food insecurity    Worry: Never true    Inability: Never true  . Transportation needs    Medical: No    Non-medical: No  Tobacco Use  . Smoking status: Never Smoker  . Smokeless tobacco: Never Used  Substance and Sexual Activity  . Alcohol use: Yes  . Drug use: Yes    Types: Marijuana    Comment: daily  . Sexual activity: Yes  Lifestyle  . Physical activity    Days per week: 4 days    Minutes per session: 80 min  . Stress: Not at all  Relationships  . Social Musicianconnections    Talks on phone: Three times a week    Gets together: More than three times a week    Attends religious service: More than 4 times per year    Active member of club or organization: No    Attends meetings of clubs or organizations: Never    Relationship status: Never married  Other Topics Concern  . Not on file  Social History Narrative  . Not on file    Has this patient used any form of tobacco in the last 30 days? (Cigarettes, Smokeless Tobacco, Cigars, and/or Pipes) Prescription not provided because: Patient does not smoke  Current Medications: Current Facility-Administered Medications  Medication Dose Route Frequency Provider Last Rate Last Dose  . divalproex (DEPAKOTE) DR tablet 500 mg  500 mg Oral QHS Rancour, Stephen, MD   500 mg at 01/31/19 1950  . lidocaine (LIDODERM) 5 % 1 patch  1 patch Transdermal Q24H Palumbo, April, MD      . OLANZapine zydis (ZYPREXA) disintegrating  tablet 10 mg  10 mg Oral QHS Rankin, Shuvon B, NP       Current Outpatient Medications  Medication Sig Dispense Refill  . ARIPiprazole ER (ABILIFY MAINTENA) 400 MG PRSY prefilled syringe Inject 400 mg into the muscle every 28 (twenty-eight) days.    . divalproex (DEPAKOTE) 500 MG DR tablet Take 1 tablet (500 mg total) by mouth at bedtime. 90 tablet 1  . Multiple Vitamin (MULTIVITAMIN WITH MINERALS) TABS tablet Take 1 tablet by mouth daily.    . Omega-3 Fatty Acids (FISH OIL) 1000 MG CAPS Take 1,000 mg by mouth daily.    Marland Kitchen  OLANZapine zydis (ZYPREXA) 10 MG disintegrating tablet Take 1 tablet (10 mg total) by mouth at bedtime. 30 tablet 0   PTA Medications: (Not in a hospital admission)   Musculoskeletal: Strength & Muscle Tone: within normal limits Gait & Station: normal Patient leans: N/A  Psychiatric Specialty Exam: Physical Exam  Nursing note and vitals reviewed. Constitutional: He is oriented to person, place, and time.  Neck: Normal range of motion.  Musculoskeletal: Normal range of motion.  Neurological: He is alert and oriented to person, place, and time.  Psychiatric: He has a normal mood and affect. His behavior is normal. Judgment and thought content normal.    Review of Systems  Psychiatric/Behavioral: Depression: Denies. Hallucinations: Denies. Memory loss: Denies. Substance abuse: THC. Suicidal ideas: Denies. Nervous/anxious: Denies. Insomnia: Denies.        Patient reports that he feels much better today but would still like a second opinion related to his diagnosis; but does not want to harm himself.    All other systems reviewed and are negative.   All other systems reviewed and are negative.   Blood pressure (!) 149/98, pulse 70, temperature 98.4 F (36.9 C), temperature source Oral, resp. rate 16, height 5\' 10"  (1.778 m), weight 94.8 kg, SpO2 100 %.Body mass index is 29.99 kg/m.  General Appearance: Casual  Eye Contact:  Good  Speech:  Clear and Coherent and Normal Rate  Volume:  Normal  Mood:  Appropriate  Affect:  Appropriate and Congruent  Thought Process:  Coherent and Goal Directed  Orientation:  Full (Time, Place, and Person)  Thought Content:  WDL  Suicidal Thoughts:  No  Homicidal Thoughts:  No  Memory:  Immediate;   Good Recent;   Good Remote;   Good  Judgement:  Fair  Insight:  Present  Psychomotor Activity:  Normal  Concentration:  Concentration: Good and Attention Span: Good  Recall:  Good  Fund of Knowledge:  Good  Language:  Good  Akathisia:  No   Handed:  Right  AIMS (if indicated):   N/A  Assets:  Communication Skills Desire for Improvement Housing Social Support Transportation  ADL's:  Intact  Cognition:  WNL  Sleep:   N/A     Demographic Factors:  Male  Loss Factors: NA  Historical Factors: NA  Risk Reduction Factors:   Sense of responsibility to family and Positive social support  Continued Clinical Symptoms:  Alcohol/Substance Abuse/Dependencies Previous Psychiatric Diagnoses and Treatments  Cognitive Features That Contribute To Risk:  None    Suicide Risk:  Minimal: No identifiable suicidal ideation.  Patients presenting with no risk factors but with morbid ruminations; may be classified as minimal risk based on the severity of the depressive symptoms  Plan Of Care/Follow-up recommendations:  Activity:  As tolerated Diet:  Heart healthy Other:  Follow up with resources given  Disposition: No evidence of imminent risk  to self or others at present.   Patient does not meet criteria for psychiatric inpatient admission. Supportive therapy provided about ongoing stressors. Discussed crisis plan, support from social network, calling 911, coming to the Emergency Department, and calling Suicide Hotline.   Shuvon Rankin, NP  02/02/2019, 12:03 PM   Patient seen by telemedicine for psychiatric evaluation, chart reviewed and case discussed with the physician extender and developed treatment plan. Reviewed the information documented and agree with the treatment plan.  Juanetta BeetsJacqueline Nathalya Wolanski, DO 02/02/19 6:02 PM

## 2019-02-02 NOTE — ED Notes (Signed)
tetephsch eval by Dr Mariea Clonts and Delphia Grates NP in progress

## 2019-02-02 NOTE — Progress Notes (Signed)
Received Daniel Mcguire this PM at the change of shift. He was constantly at the nurses station making requests for food, Advil or Aleve and Benadryl. He refused all of his HS medications, insisting he does not need them. He rested/ slept for one hour intervals in his room throughout most of the night.

## 2019-02-05 ENCOUNTER — Encounter (HOSPITAL_COMMUNITY): Payer: Self-pay

## 2019-02-05 ENCOUNTER — Other Ambulatory Visit: Payer: Self-pay

## 2019-02-05 ENCOUNTER — Emergency Department (HOSPITAL_COMMUNITY)
Admission: EM | Admit: 2019-02-05 | Discharge: 2019-02-05 | Disposition: A | Discharge status: Home / Self Care | Payer: Medicaid Other | Attending: Emergency Medicine | Admitting: Emergency Medicine

## 2019-02-05 DIAGNOSIS — Z8709 Personal history of other diseases of the respiratory system: Secondary | ICD-10-CM

## 2019-02-05 DIAGNOSIS — Z79899 Other long term (current) drug therapy: Secondary | ICD-10-CM | POA: Insufficient documentation

## 2019-02-05 DIAGNOSIS — F419 Anxiety disorder, unspecified: Secondary | ICD-10-CM | POA: Insufficient documentation

## 2019-02-05 DIAGNOSIS — J45909 Unspecified asthma, uncomplicated: Secondary | ICD-10-CM | POA: Insufficient documentation

## 2019-02-05 DIAGNOSIS — R05 Cough: Secondary | ICD-10-CM | POA: Insufficient documentation

## 2019-02-05 MED ORDER — HYDROXYZINE HCL 25 MG PO TABS: 25.0000 mg | ORAL_TABLET | Freq: Four times a day (QID) | ORAL | 0 refills | Status: AC

## 2019-02-05 MED ORDER — ALBUTEROL SULFATE HFA 108 (90 BASE) MCG/ACT IN AERS: 2.0000 | INHALATION_SPRAY | RESPIRATORY_TRACT | 0 refills | Status: AC | PRN

## 2019-02-05 MED ORDER — AEROCHAMBER PLUS MISC: 2 refills | Status: AC

## 2019-02-05 MED ORDER — HYDROXYZINE HCL 25 MG PO TABS
25.0000 mg | ORAL_TABLET | Freq: Once | ORAL | Status: AC
  Administered 2019-02-05: 25 mg via ORAL
  Filled 2019-02-05: qty 1

## 2019-02-05 NOTE — ED Provider Notes (Signed)
[facility]-EMERGENCY DEPT Provider Note   CSN: [id number] Arrival date & time: 02/05/19  0032    History   Chief Complaint Chief Complaint  Patient presents with  . Anxiety    HPI Daniel Mcguire is a 28 y.o. male with a history of paranoid schizophrenia and bipolar disorder who presents to the emergency department with a chief complaint of anxiety.  He reports that he has been feeling more anxious over the last week.  He reports that he was seen at behavioral health and given Abilify.  He reports that he has been feeling anxious since receiving Abilify.  He is concerned that he may be having an allergic reaction to the medication since he keeps having anxiety about it.  He reports he has a history of asthma as a child and has been having some cough and wheezing that is worse at night.  He reports this comes and goes.  States that it seems to be worse around cigarette smoke.  He is having no coughing or wheezing now.  He would like an albuterol inhaler as he is out of his home medication.  He denies fevers, chills, rash, lip swelling, throat closing chest pain, shortness of breath, palpitations, leg swelling, abdominal pain, nausea, vomiting, or diarrhea.  No SI, HI, or auditory visual hallucinations.     The history is provided by the patient. No language interpreter was used.    History reviewed. No pertinent past medical history.  Patient Active Problem List   Diagnosis Date Noted  . Bipolar disorder (HCC) 02/01/2019  . Acute psychosis (HCC) 01/22/2019  . Schizophrenia, paranoid (HCC) 01/22/2019    History reviewed. No pertinent surgical history.      Home Medications    Prior to Admission medications   Medication Sig Start Date End Date Taking? Authorizing Provider  ARIPiprazole ER (ABILIFY MAINTENA) 400 MG PRSY prefilled syringe Inject 400 mg into the muscle every 28 (twenty-eight) days.   Yes [provider]  divalproex (DEPAKOTE)  500 MG DR tablet Take 1 tablet (500 mg total) by mouth at bedtime. 01/28/19  Yes Malvin JohnsFarah, Brian, MD  Multiple Vitamin (MULTIVITAMIN WITH MINERALS) TABS tablet Take 1 tablet by mouth daily.   Yes [provider]  OLANZapine zydis (ZYPREXA) 10 MG disintegrating tablet Take 1 tablet (10 mg total) by mouth at bedtime. 02/02/19  Yes Rankin, Shuvon B, NP  Omega-3 Fatty Acids (FISH OIL) 1000 MG CAPS Take 1,000 mg by mouth daily.   Yes [provider]  albuterol (VENTOLIN HFA) 108 (90 Base) MCG/ACT inhaler Inhale 2 puffs into the lungs every 4 (four) hours as needed for wheezing or shortness of breath. 02/05/19   Sharea Guinther A, PA-C  hydrOXYzine (ATARAX/VISTARIL) 25 MG tablet Take 1 tablet (25 mg total) by mouth every 6 (six) hours. 02/05/19   Giancarlo Askren A, PA-C  Spacer/Aero-Holding Chambers (AEROCHAMBER PLUS) inhaler Use as instructed 02/05/19   Laverta Harnisch A, PA-C    Family History No family history on file.  Social History Social History   Tobacco Use  . Smoking status: Never Smoker  . Smokeless tobacco: Never Used  Substance Use Topics  . Alcohol use: Yes  . Drug use: Yes    Types: Marijuana    Comment: daily     Allergies   Ativan [lorazepam]   Review of Systems Review of Systems  Constitutional: Negative for appetite change, chills and fever.  HENT: Negative for congestion and tinnitus.   Respiratory: Positive for cough (  resolved) and wheezing (resolved). Negative for shortness of breath.   Cardiovascular: Negative for chest pain.  Gastrointestinal: Negative for abdominal pain, diarrhea, nausea and vomiting.  Genitourinary: Negative for dysuria.  Musculoskeletal: Negative for back pain.  Skin: Negative for rash.  Allergic/Immunologic: Negative for immunocompromised state.  Neurological: Negative for headaches.  Psychiatric/Behavioral: Negative for confusion.     Physical Exam Updated Vital Signs BP (!) 151/84   Pulse 61   Temp 99.1 F (37.3 C) (Oral)    Resp 16   SpO2 99%   Physical Exam Vitals signs and nursing note reviewed.  Constitutional:      General: He is not in acute distress.    Appearance: He is well-developed. He is not ill-appearing, toxic-appearing or diaphoretic.  HENT:     Head: Normocephalic.  Eyes:     Conjunctiva/sclera: Conjunctivae normal.  Neck:     Musculoskeletal: Neck supple.  Cardiovascular:     Rate and Rhythm: Normal rate and regular rhythm.     Pulses: Normal pulses.     Heart sounds: Normal heart sounds. No murmur. No friction rub. No gallop.   Pulmonary:     Effort: Pulmonary effort is normal. No respiratory distress.     Breath sounds: No stridor. No wheezing, rhonchi or rales.  Chest:     Chest wall: No tenderness.  Abdominal:     General: There is no distension.     Palpations: Abdomen is soft. There is no mass.     Tenderness: There is no abdominal tenderness. There is no right CVA tenderness, left CVA tenderness, guarding or rebound.     Hernia: No hernia is present.  Skin:    General: Skin is warm and dry.  Neurological:     Mental Status: He is alert.  Psychiatric:        Attention and Perception: Attention normal.        Mood and Affect: Mood and affect normal. Mood is not anxious.        Behavior: Behavior normal. Behavior is cooperative.      ED Treatments / Results  Labs (all labs ordered are listed, but only abnormal results are displayed) Labs Reviewed - No data to display  EKG None  Radiology No results found.  Procedures Procedures (including critical care time)  Medications Ordered in ED Medications  hydrOXYzine (ATARAX/VISTARIL) tablet 25 mg (25 mg Oral Given 02/05/19 0620)     Initial Impression / Assessment and Plan / ED Course  I have reviewed the triage vital signs and the nursing notes.  Pertinent labs & imaging results that were available during my care of the patient were reviewed by me and considered in my medical decision making (see chart for  details).        28 year old male with history of paranoid schizophrenia and bipolar disorder presenting with concerns about Abilify ER, which she was given last week.  Physical exam is unremarkable.  Low suspicion for allergic reaction or anaphylaxis.  He is concerned that he has been having intermittent wheezing and cough related to his history of childhood asthma.  He is requesting an inhaler for home use and prescription has been given.  Since the patient is also concerned about anxiety he will be given a prescription for Vistaril and [facility].  He is hemodynamically stable and in no acute distress.  Safe for discharge home with outpatient follow-up as needed.   Final Clinical Impressions(s) / ED Diagnoses   Final diagnoses:  Anxiety  History of asthma    ED Discharge Orders         Ordered    albuterol (VENTOLIN HFA) 108 (90 Base) MCG/ACT inhaler  Every 4 hours PRN     02/05/19 0616    Spacer/Aero-Holding Chambers (AEROCHAMBER PLUS) inhaler     02/05/19 0616    hydrOXYzine (ATARAX/VISTARIL) 25 MG tablet  Every 6 hours     02/05/19 0616           Joline Maxcy A, PA-C 02/05/19 0630    Molpus, Jenny Reichmann, MD 02/05/19 [phone]

## 2019-02-05 NOTE — ED Triage Notes (Signed)
Pt arrived via [facility] stating he has increased anxiety over the last two days. Pt reports having an ativan shot last week and thinks he is having an allergic reaction to it because is anxiety has worsened.

## 2019-02-05 NOTE — Discharge Instructions (Addendum)
Thank you for allowing me to care for you today in the Emergency Department.   Take 1 tablet of Vistaril every 6 hours as needed for anxiety.  It can also help with itching if you develop an allergic reaction.  Do not work or drive with this medication until you know how it impacts you as it may make you slightly drowsy.  Use 2 puffs of the albuterol inhaler with a spacer every 4 hours as needed for wheezing or shortness of breath related to your asthma.  Return to the emergency department if you develop respiratory distress, if you pass out, if your lips or fingers turn blue, if you have thoughts of wanting to hurt yourself or others, or other new, concerning symptoms.

## 2019-02-10 ENCOUNTER — Other Ambulatory Visit: Payer: Self-pay

## 2019-02-10 ENCOUNTER — Ambulatory Visit (HOSPITAL_COMMUNITY)
Admission: EM | Admit: 2019-02-10 | Discharge: 2019-02-10 | Disposition: A | Discharge status: Home / Self Care | Payer: Self-pay | Attending: Psychiatry | Admitting: Psychiatry

## 2019-02-10 ENCOUNTER — Emergency Department (HOSPITAL_COMMUNITY)
Admission: EM | Admit: 2019-02-10 | Discharge: 2019-02-10 | Disposition: A | Discharge status: Home / Self Care | Payer: Medicaid Other | Attending: Emergency Medicine | Admitting: Emergency Medicine

## 2019-02-10 ENCOUNTER — Encounter (HOSPITAL_COMMUNITY): Payer: Self-pay | Admitting: Behavioral Health

## 2019-02-10 DIAGNOSIS — F419 Anxiety disorder, unspecified: Secondary | ICD-10-CM | POA: Insufficient documentation

## 2019-02-10 DIAGNOSIS — Z79899 Other long term (current) drug therapy: Secondary | ICD-10-CM | POA: Insufficient documentation

## 2019-02-10 DIAGNOSIS — F200 Paranoid schizophrenia: Secondary | ICD-10-CM | POA: Insufficient documentation

## 2019-02-10 DIAGNOSIS — R451 Restlessness and agitation: Secondary | ICD-10-CM | POA: Insufficient documentation

## 2019-02-10 MED ORDER — ALPRAZOLAM 0.25 MG PO TABS: 0.2500 mg | ORAL_TABLET | Freq: Every evening | ORAL | 0 refills | Status: AC | PRN

## 2019-02-10 MED ORDER — ALPRAZOLAM 0.25 MG PO TABS
0.2500 mg | ORAL_TABLET | Freq: Once | ORAL | Status: AC
  Administered 2019-02-10: 13:00:00 0.25 mg via ORAL
  Filled 2019-02-10: qty 1

## 2019-02-10 NOTE — ED Provider Notes (Signed)
[facility]-EMERGENCY DEPT Provider Note   CSN: [id number] Arrival date & time: 02/10/19  1136     History   Chief Complaint Chief Complaint  Patient presents with  . Anxiety    HPI Daniel Mcguire is a 28 y.o. male.     Level 5 caveat for psychiatric condition.  Chief complaint anxiety for several weeks which patient attributes to an Abilify injection.  Recent admission to the psychiatric hospital for paranoid schizophrenia and bipolar disorder.  He has been taking Vistaril but this is not helping.  [facility].  No suicidal or homicidal ideation.     No past medical history on file.  Patient Active Problem List   Diagnosis Date Noted  . Bipolar disorder (HCC) 02/01/2019  . Acute psychosis (HCC) 01/22/2019  . Schizophrenia, paranoid (HCC) 01/22/2019    No past surgical history on file.      Home Medications    Prior to Admission medications   Medication Sig Start Date End Date Taking? Authorizing Provider  albuterol (VENTOLIN HFA) 108 (90 Base) MCG/ACT inhaler Inhale 2 puffs into the lungs every 4 (four) hours as needed for wheezing or shortness of breath. 02/05/19   McDonald, Mia A, PA-C  ALPRAZolam (XANAX) 0.25 MG tablet Take 1 tablet (0.25 mg total) by mouth at bedtime as needed for up to 1 dose for anxiety (Can take 1 dose per day for anxiety.). 02/10/19   Donnetta Hutchingook, Saphire Barnhart, MD  ARIPiprazole ER (ABILIFY MAINTENA) 400 MG PRSY prefilled syringe Inject 400 mg into the muscle every 28 (twenty-eight) days.    [provider]  divalproex (DEPAKOTE) 500 MG DR tablet Take 1 tablet (500 mg total) by mouth at bedtime. 01/28/19   Malvin JohnsFarah, Abbrielle Batts, MD  hydrOXYzine (ATARAX/VISTARIL) 25 MG tablet Take 1 tablet (25 mg total) by mouth every 6 (six) hours. 02/05/19   McDonald, Mia A, PA-C  Multiple Vitamin (MULTIVITAMIN WITH MINERALS) TABS tablet Take 1 tablet by mouth daily.    [provider]  OLANZapine zydis (ZYPREXA)  10 MG disintegrating tablet Take 1 tablet (10 mg total) by mouth at bedtime. 02/02/19   Rankin, Shuvon B, NP  Omega-3 Fatty Acids (FISH OIL) 1000 MG CAPS Take 1,000 mg by mouth daily.    [provider]  Spacer/Aero-Holding Chambers (AEROCHAMBER PLUS) inhaler Use as instructed 02/05/19   McDonald, Mia A, PA-C    Family History No family history on file.  Social History Social History   Tobacco Use  . Smoking status: Never Smoker  . Smokeless tobacco: Never Used  Substance Use Topics  . Alcohol use: Yes  . Drug use: Yes    Types: Marijuana    Comment: daily     Allergies   Ativan [lorazepam]   Review of Systems Review of Systems  Unable to perform ROS: Psychiatric disorder     Physical Exam Updated Vital Signs BP (!) 166/91   Pulse 70   Temp 98.8 F (37.1 C) (Oral)   Resp 18   SpO2 98%   Physical Exam Vitals signs and nursing note reviewed.  Constitutional:      Appearance: He is well-developed.  HENT:     Head: Normocephalic and atraumatic.  Eyes:     Conjunctiva/sclera: Conjunctivae normal.  Neck:     Musculoskeletal: Neck supple.  Cardiovascular:     Rate and Rhythm: Normal rate and regular rhythm.  Pulmonary:     Effort: Pulmonary effort is normal.  Breath sounds: Normal breath sounds.  Abdominal:     General: Bowel sounds are normal.     Palpations: Abdomen is soft.  Musculoskeletal: Normal range of motion.  Skin:    General: Skin is warm and dry.  Neurological:     Mental Status: He is alert and oriented to person, place, and time.  Psychiatric:     Comments: Pressured speech, flight of ideas.  No evidence of true psychosis.      ED Treatments / Results  Labs (all labs ordered are listed, but only abnormal results are displayed) Labs Reviewed - No data to display  EKG None  Radiology No results found.  Procedures Procedures (including critical care time)  Medications Ordered in ED Medications  ALPRAZolam (XANAX)  tablet 0.25 mg (0.25 mg Oral Given 02/10/19 1246)     Initial Impression / Assessment and Plan / ED Course  I have reviewed the triage vital signs and the nursing notes.  Pertinent labs & imaging results that were available during my care of the patient were reviewed by me and considered in my medical decision making (see chart for details).       Patient presents with anxiety which he relates to his Abilify.  Will Rx Xanax 0.25 mg.  He has an outpatient appointment today at [facility].   Final Clinical Impressions(s) / ED Diagnoses   Final diagnoses:  Anxiety    ED Discharge Orders         Ordered    ALPRAZolam (XANAX) 0.25 MG tablet  At bedtime PRN     02/10/19 1251           Nat Christen, MD 02/10/19 1335

## 2019-02-10 NOTE — BH Assessment (Signed)
Assessment Note  Daniel Mcguire is an 28 y.o. male who presented to [facility] as a voluntary walk-in with complaint of irritability due to Abilify injection given on 01/27/2019.  Pt was assessed twice before in July 2020.   In early July, Pt was treated for psychosis and given an injection of Abilify.  Pt now returns to [facility] with complaint that the Abilify does not work, that he is drowsy and anxious most of the time.  Pt was irritable in affect, and he stated that he did not trust author because Pryor Curia is white.  Pt stated that he wanted a physician to speak with him.  Pt denied hallucination, suicidal ideation, and homicidal ideation.  Pt stated that he wanted help getting the Abilify out of his system.  Pt endorsed feeling uneasy most of the time.  He denied needing help for other issues.  Pt's demeanor was irritable and suspicious.  He exhibited paranoia -- he said he does not trust white people, and he wanted to see what Pryor Curia wrote on his pad.  Pt's speech was angry in tone, but otherwise normal in rate, rhythm, and volume.  Pt's thought processes were within normal range, and thought content was logical and goal-oriented.  There was no evidence of delusion.  Pt's memory and concentration were intact.  Insight, judgment, and impulse control were fair to poor.  Consulted with L. Marcello Moores, FNP, who also spoke with Pt.  Pt to be discharged.  Diagnosis: Bipolar Disorder; r/o acute psychosis; cannabis use disorder  Past Medical History: No past medical history on file.  No past surgical history on file.  Family History: No family history on file.  Social History:  reports that he has never smoked. He has never used smokeless tobacco. He reports current alcohol use. He reports current drug use. Drug: Marijuana.  Additional Social History:  Alcohol / Drug Use Pain Medications: See MAR Prescriptions: See MAR Over the Counter: See MAR History of alcohol / drug use?: Yes Substance #1 Name of Substance 1:  Cannabis 1 - Age of First Use: Teenager 1 - Amount (size/oz): Varied 1 - Frequency: DAily 1 - Duration: Ongoing  CIWA: CIWA-Ar BP: (!) 149/96 Pulse Rate: 89 COWS:    Allergies:  Allergies  Allergen Reactions  . Ativan [Lorazepam] Shortness Of Breath    Home Medications: (Not in a hospital admission)   OB/GYN Status:  No LMP for male patient.  General Assessment Data Location of Assessment: [facility] Services TTS Assessment: In system Is this a Tele or Face-to-Face Assessment?: Face-to-Face Is this an Initial Assessment or a Re-assessment for this encounter?: Initial Assessment Patient Accompanied by:: N/A Language Other than English: No Living Arrangements: Other (Comment) What gender do you identify as?: Male Marital status: Single Pregnancy Status: No Can pt return to current living arrangement?: Yes Admission Status: Voluntary Referral Source: Self/Family/Friend Insurance type: MCD     Crisis Care Plan Name of Psychiatrist: None Name of Therapist: None  Education Status Is patient currently in school?: No Highest grade of school patient has completed: Bachelor's degree in Mathematics Is the patient employed, unemployed or receiving disability?: Unemployed  Risk to self with the past 6 months Suicidal Ideation: No Has patient been a risk to self within the past 6 months prior to admission? : No Suicidal Intent: No Has patient had any suicidal intent within the past 6 months prior to admission? : No Is patient at risk for suicide?: No Suicidal Plan?: No Has patient had any suicidal  plan within the past 6 months prior to admission? : No Access to Means: No What has been your use of drugs/alcohol within the last 12 months?: Marijuana Previous Attempts/Gestures: No How many times?: 0 Intentional Self Injurious Behavior: None Family Suicide History: No Recent stressful life event(s): Conflict (Comment)(Conflict w/roommate; does not like  Abilify) Persecutory voices/beliefs?: No(Pt denied; but per history) Depression: Yes Depression Symptoms: Despondent, Fatigue, Isolating, Feeling angry/irritable Substance abuse history and/or treatment for substance abuse?: Yes Suicide prevention information given to non-admitted patients: Not applicable  Risk to Others within the past 6 months Homicidal Ideation: No Does patient have any lifetime risk of violence toward others beyond the six months prior to admission? : No Thoughts of Harm to Others: No Current Homicidal Intent: No Current Homicidal Plan: No Access to Homicidal Means: No History of harm to others?: Yes Assessment of Violence: In distant past Violent Behavior Description: Per history, over three years ago Does patient have access to weapons?: No Criminal Charges Pending?: Yes Describe Pending Criminal Charges: Fel. Possession stolen motor vehicle Does patient have a court date: Yes Court Date: 02/16/19 Is patient on probation?: Unknown  Psychosis Hallucinations: None noted Delusions: None noted  Mental Status Report Appearance/Hygiene: Unremarkable Eye Contact: Good Motor Activity: Unremarkable Speech: Logical/coherent, Aggressive Level of Consciousness: Alert Mood: Angry, Suspicious Affect: Angry Anxiety Level: Moderate Thought Processes: Coherent, Relevant Judgement: Partial Orientation: Person, Place, Time, Situation Obsessive Compulsive Thoughts/Behaviors: None  Cognitive Functioning Concentration: Normal Memory: Recent Intact, Remote Intact Is patient IDD: No Insight: Poor Impulse Control: Fair Appetite: Good Have you had any weight changes? : No Change Sleep: No Change Total Hours of Sleep: 8 Vegetative Symptoms: None  ADLScreening [facility]) Patient's cognitive ability adequate to safely complete daily activities?: Yes Patient able to express need for assistance with ADLs?: Yes Independently performs ADLs?: Yes  (appropriate for developmental age)  Prior Inpatient Therapy Prior Inpatient Therapy: Yes Prior Therapy Dates: July 3-10, 2020 [facility] Reason for Treatment: schizophrenia  Prior Outpatient Therapy Prior Outpatient Therapy: No Does patient have an ACCT team?: No Does patient have Intensive In-House Services?  : No Does patient have [facility] services? : No Does patient have P4CC services?: No  ADL Screening (condition at time of admission) Patient's cognitive ability adequate to safely complete daily activities?: Yes Is the patient deaf or have difficulty hearing?: No Does the patient have difficulty seeing, even when wearing glasses/contacts?: No Does the patient have difficulty concentrating, remembering, or making decisions?: No Patient able to express need for assistance with ADLs?: Yes Does the patient have difficulty dressing or bathing?: No Independently performs ADLs?: Yes (appropriate for developmental age) Does the patient have difficulty walking or climbing stairs?: No Weakness of Legs: None Weakness of Arms/Hands: None  Home Assistive Devices/Equipment Home Assistive Devices/Equipment: None  Therapy Consults (therapy consults require a physician order) PT Evaluation Needed: No OT Evalulation Needed: No SLP Evaluation Needed: No       Advance Directives (For Healthcare) Does Patient Have a Medical Advance Directive?: No          Disposition:  Disposition Initial Assessment Completed for this Encounter: Yes Disposition of Patient: Discharge(Per )  On Site Evaluation by:   Reviewed with Physician:    Dorris FetchEugene T [name] 02/10/2019 11:15 AM

## 2019-02-10 NOTE — ED Triage Notes (Signed)
Pt reports that he has had increased anxiety that was "caused by that abilify shot yall gave me" Pt reports he has run out of his vistaril. Pt reports he took 2 doses already this morning.  Pt denies SI/HI

## 2019-02-10 NOTE — H&P (Signed)
Behavioral Health Medical Screening Exam  Daniel Mcguire is an 28 y.o. male.who presents as a walk-in to [facility], accompanied alone, voluntarily, although transported by EMS. Patient has a history of Schizophrenia, paranoid along with aggressive behaviors. During this evaluation, he is  very agitated during the assessment. Per nurse, patient made threats directed to staff on the adult unit  As soon as he entered the hospital. Patient states he is upset because the last time he was here, he was misdiagnosed with paranoid schizophrenia. He states he is not paranoid or schizophrenic and the Abilify (long-acting injectable) which he started here at the hospital is making him anxious. He was discharged from [facility] 01/28/2019. He reports he has an appointment with [facility] today. He denies thoughts of wanting to harming self or others. Denies psychosis. Discussed case with Dr. Dwyane Dee who states patient patient does not meet inpatient criteria. I discuss this with patient who became very upset with more focus on wanting to stop his Abilify. I advised him to follow-up with [facility] to discuss medication concerns. Patient remained upset/agitated.  .     Total Time spent with patient: 20 minutes  Psychiatric Specialty Exam: Physical Exam  Constitutional: He is oriented to person, place, and time.  Neurological: He is alert and oriented to person, place, and time.    Review of Systems  Psychiatric/Behavioral: Negative for depression, hallucinations, memory loss, substance abuse and suicidal ideas. The patient is nervous/anxious. The patient does not have insomnia.     Blood pressure (!) 149/96, pulse 89, temperature 98.6 F (37 C), temperature source Oral, resp. rate 18, SpO2 100 %.There is no height or weight on file to calculate BMI.  General Appearance: Casual  Eye Contact:  Good  Speech:  Clear and Coherent and Normal Rate  Volume:  Normal  Mood:  Irritable  Affect:  Restricted  Thought Process:  Irrelevant and  Descriptions of Associations: Tangential  Orientation:  Full (Time, Place, and Person)  Thought Content:  Tangential  Suicidal Thoughts:  No  Homicidal Thoughts:  No  Memory:  Immediate;   Fair Recent;   Fair  Judgement:  Impaired  Insight:  Shallow  Psychomotor Activity:  Normal  Concentration: Concentration: Fair and Attention Span: Fair  Recall:  [employer] of Knowledge:Fair  Language: Good  Akathisia:  Negative  Handed:  Right  AIMS (if indicated):     Assets:  Communication Skills Desire for Improvement  Sleep:       Musculoskeletal: Strength & Muscle Tone: within normal limits Gait & Station: normal Patient leans: N/A  Blood pressure (!) 149/96, pulse 89, temperature 98.6 F (37 C), temperature source Oral, resp. rate 18, SpO2 100 %.  Recommendations:  Based on my evaluation the patient does not appear to have an emergency medical condition.  Patient does not meet criteria for inpatient psychiatric hospitalization at this time. Recommended to follow-up with [facility] to discuss medication concerns.    Mordecai Maes, NP 02/10/2019, 11:12 AM

## 2019-02-10 NOTE — Discharge Instructions (Addendum)
Prescription for anxiety medicine.  Make sure you go to your appointment today at 3 PM.

## 2019-02-12 ENCOUNTER — Other Ambulatory Visit: Payer: Self-pay

## 2019-02-12 DIAGNOSIS — Z20822 Contact with and (suspected) exposure to covid-19: Secondary | ICD-10-CM

## 2019-02-14 ENCOUNTER — Emergency Department (HOSPITAL_COMMUNITY)
Admission: EM | Admit: 2019-02-14 | Discharge: 2019-02-14 | Disposition: A | Discharge status: Home / Self Care | Payer: Medicaid Other | Attending: Emergency Medicine | Admitting: Emergency Medicine

## 2019-02-14 ENCOUNTER — Other Ambulatory Visit: Payer: Self-pay

## 2019-02-14 ENCOUNTER — Encounter (HOSPITAL_COMMUNITY): Payer: Self-pay

## 2019-02-14 DIAGNOSIS — Z590 Homelessness: Secondary | ICD-10-CM | POA: Insufficient documentation

## 2019-02-14 DIAGNOSIS — Z8659 Personal history of other mental and behavioral disorders: Secondary | ICD-10-CM

## 2019-02-14 DIAGNOSIS — F29 Unspecified psychosis not due to a substance or known physiological condition: Secondary | ICD-10-CM

## 2019-02-14 DIAGNOSIS — R5383 Other fatigue: Secondary | ICD-10-CM | POA: Insufficient documentation

## 2019-02-14 DIAGNOSIS — Z0279 Encounter for issue of other medical certificate: Secondary | ICD-10-CM | POA: Insufficient documentation

## 2019-02-14 HISTORY — DX: Personal history of other mental and behavioral disorders: Z86.59

## 2019-02-14 HISTORY — DX: Unspecified psychosis not due to a substance or known physiological condition: F29

## 2019-02-14 NOTE — ED Provider Notes (Signed)
[facility] DEPT Provider Note   CSN: [id number] Arrival date & time: 02/14/19  1038    History   Chief Complaint Chief Complaint  Patient presents with  . Needs Doctor Note    HPI Daniel Mcguire is a 28 y.o. male.     HPI   Daniel Mcguire is a 28 y.o. male, with a history of schizophrenia, presenting to the ED with request for doctor's note.  He is currently staying at a homeless shelter, is experiencing daytime fatigue, and is requesting a doctor's note to allow him to lay down during the day and rest. He attributes his feelings of fatigue to the Abilify injection he received July 8.  He also notes he has been taking nightly Xanax and occasional hydroxyzine. He states he is sure that his symptoms are due to the Abilify. Denies fever/chills, N/V/D, A/V hallucinations, chest pain, shortness of breath, cough, abdominal pain, or any other complaints.   Past Medical History:  Diagnosis Date  . History of schizophrenia 02/14/2019  . Psychosis ([place]) 02/14/2019    Patient Active Problem List   Diagnosis Date Noted  . Bipolar disorder ([place]) 02/01/2019  . Acute psychosis ([place]) 01/22/2019  . Schizophrenia, paranoid ([place]) 01/22/2019    History reviewed. No pertinent surgical history.      Home Medications    Prior to Admission medications   Medication Sig Start Date End Date Taking? Authorizing Provider  albuterol (VENTOLIN HFA) 108 (90 Base) MCG/ACT inhaler Inhale 2 puffs into the lungs every 4 (four) hours as needed for wheezing or shortness of breath. 02/05/19  Yes McDonald, Mia A, PA-C  ALPRAZolam (XANAX) 0.25 MG tablet Take 1 tablet (0.25 mg total) by mouth at bedtime as needed for up to 1 dose for anxiety (Can take 1 dose per day for anxiety.). 02/10/19  Yes Nat Christen, MD  diphenhydrAMINE (BENADRYL) 25 MG tablet Take 25 mg by mouth every 6 (six) hours as needed for allergies or sleep.   Yes [provider]  Multiple Vitamin  (MULTIVITAMIN WITH MINERALS) TABS tablet Take 1 tablet by mouth daily.   Yes [provider]  Omega-3 Fatty Acids (FISH OIL) 1000 MG CAPS Take 1,000 mg by mouth daily.   Yes [provider]  divalproex (DEPAKOTE) 500 MG DR tablet Take 1 tablet (500 mg total) by mouth at bedtime. Patient not taking: Reported on 02/14/2019 01/28/19   Johnn Hai, MD  hydrOXYzine (ATARAX/VISTARIL) 25 MG tablet Take 1 tablet (25 mg total) by mouth every 6 (six) hours. Patient not taking: Reported on 02/14/2019 02/05/19   McDonald, Mia A, PA-C  OLANZapine zydis (ZYPREXA) 10 MG disintegrating tablet Take 1 tablet (10 mg total) by mouth at bedtime. Patient not taking: Reported on 02/14/2019 02/02/19   Rankin, Delphia Grates B, NP  Spacer/Aero-Holding Chambers (AEROCHAMBER PLUS) inhaler Use as instructed Patient not taking: Reported on 02/14/2019 02/05/19   Joanne Gavel, PA-C    Family History History reviewed. No pertinent family history.  Social History Social History   Tobacco Use  . Smoking status: Never Smoker  . Smokeless tobacco: Never Used  Substance Use Topics  . Alcohol use: Yes  . Drug use: Yes    Types: Marijuana    Comment: daily     Allergies   Abilify [aripiprazole] and Ativan [lorazepam]   Review of Systems Review of Systems  Constitutional: Positive for fatigue. Negative for chills and fever.  Respiratory: Negative for cough and shortness of breath.   Cardiovascular: Negative  for chest pain.  Gastrointestinal: Negative for abdominal pain, diarrhea, nausea and vomiting.  Neurological: Negative for dizziness, syncope, weakness, light-headedness and numbness.  All other systems reviewed and are negative.    Physical Exam Updated Vital Signs BP (!) 152/77 (BP Location: Right Arm)   Pulse (!) 59   Temp 97.6 F (36.4 C) (Oral)   Resp 17   Ht 5\' 10"  (1.778 m)   Wt 94.8 kg   SpO2 99%   BMI 29.99 kg/m   Physical Exam Vitals signs and nursing note reviewed.   Constitutional:      General: He is not in acute distress.    Appearance: He is well-developed. He is not diaphoretic.  HENT:     Head: Normocephalic and atraumatic.  Eyes:     Conjunctiva/sclera: Conjunctivae normal.  Neck:     Musculoskeletal: Neck supple.  Cardiovascular:     Rate and Rhythm: Normal rate and regular rhythm.  Pulmonary:     Effort: Pulmonary effort is normal.  Skin:    General: Skin is warm and dry.     Coloration: Skin is not pale.  Neurological:     Mental Status: He is alert and oriented to person, place, and time.  Psychiatric:        Mood and Affect: Mood and affect normal.        Speech: Speech normal.        Behavior: Behavior normal.      ED Treatments / Results  Labs (all labs ordered are listed, but only abnormal results are displayed) Labs Reviewed - No data to display  EKG None  Radiology No results found.  Procedures Procedures (including critical care time)  Medications Ordered in ED Medications - No data to display   Initial Impression / Assessment and Plan / ED Course  I have reviewed the triage vital signs and the nursing notes.  Pertinent labs & imaging results that were available during my care of the patient were reviewed by me and considered in my medical decision making (see chart for details).        Patient presents requesting a doctor's note in order to lay down during the day at the homeless shelter.  I discussed with him the possibility that his fatigue could be due to the other medications he is taking and not because of the Abilify.  He states this could not be the case and he is sure it is due to the Abilify.  I advised him to follow-up with behavioral health for medication evaluation and possible adjustment. The patient was given instructions for home care as well as return precautions. Patient voices understanding of these instructions, accepts the plan, and is comfortable with discharge.  Final Clinical  Impressions(s) / ED Diagnoses   Final diagnoses:  Fatigue, unspecified type    ED Discharge Orders    None       Concepcion LivingJoy,  C, PA-C 02/14/19 1133    Linwood DibblesKnapp, Jon, MD 02/15/19 1248

## 2019-02-14 NOTE — Discharge Instructions (Addendum)
Follow-up with behavioral health soon as possible for medication evaluation. Any drowsiness and difficulty with sleeping could be due to the Xanax and/or hydroxyzine.  Maitri cutting the dose of Xanax in half or using the hydroxyzine by itself.

## 2019-02-14 NOTE — ED Triage Notes (Signed)
Patient arrived via [facility] from homeless shelter. Patient is AOx4 and ambulatory. Patient chief complaint is needing a doctors note to stay at homeless shelter. Patient has no other complaints.

## 2019-02-15 LAB — NOVEL CORONAVIRUS, NAA: SARS-CoV-2, NAA: NOT DETECTED

## 2021-06-11 IMAGING — CR CHEST - 2 VIEW
2 series · 2 of 2 positions shown · non-contrast
Comparison: None.

CLINICAL DATA: Shortness of breath

EXAM:
CHEST - 2 VIEW

[w chest pa]
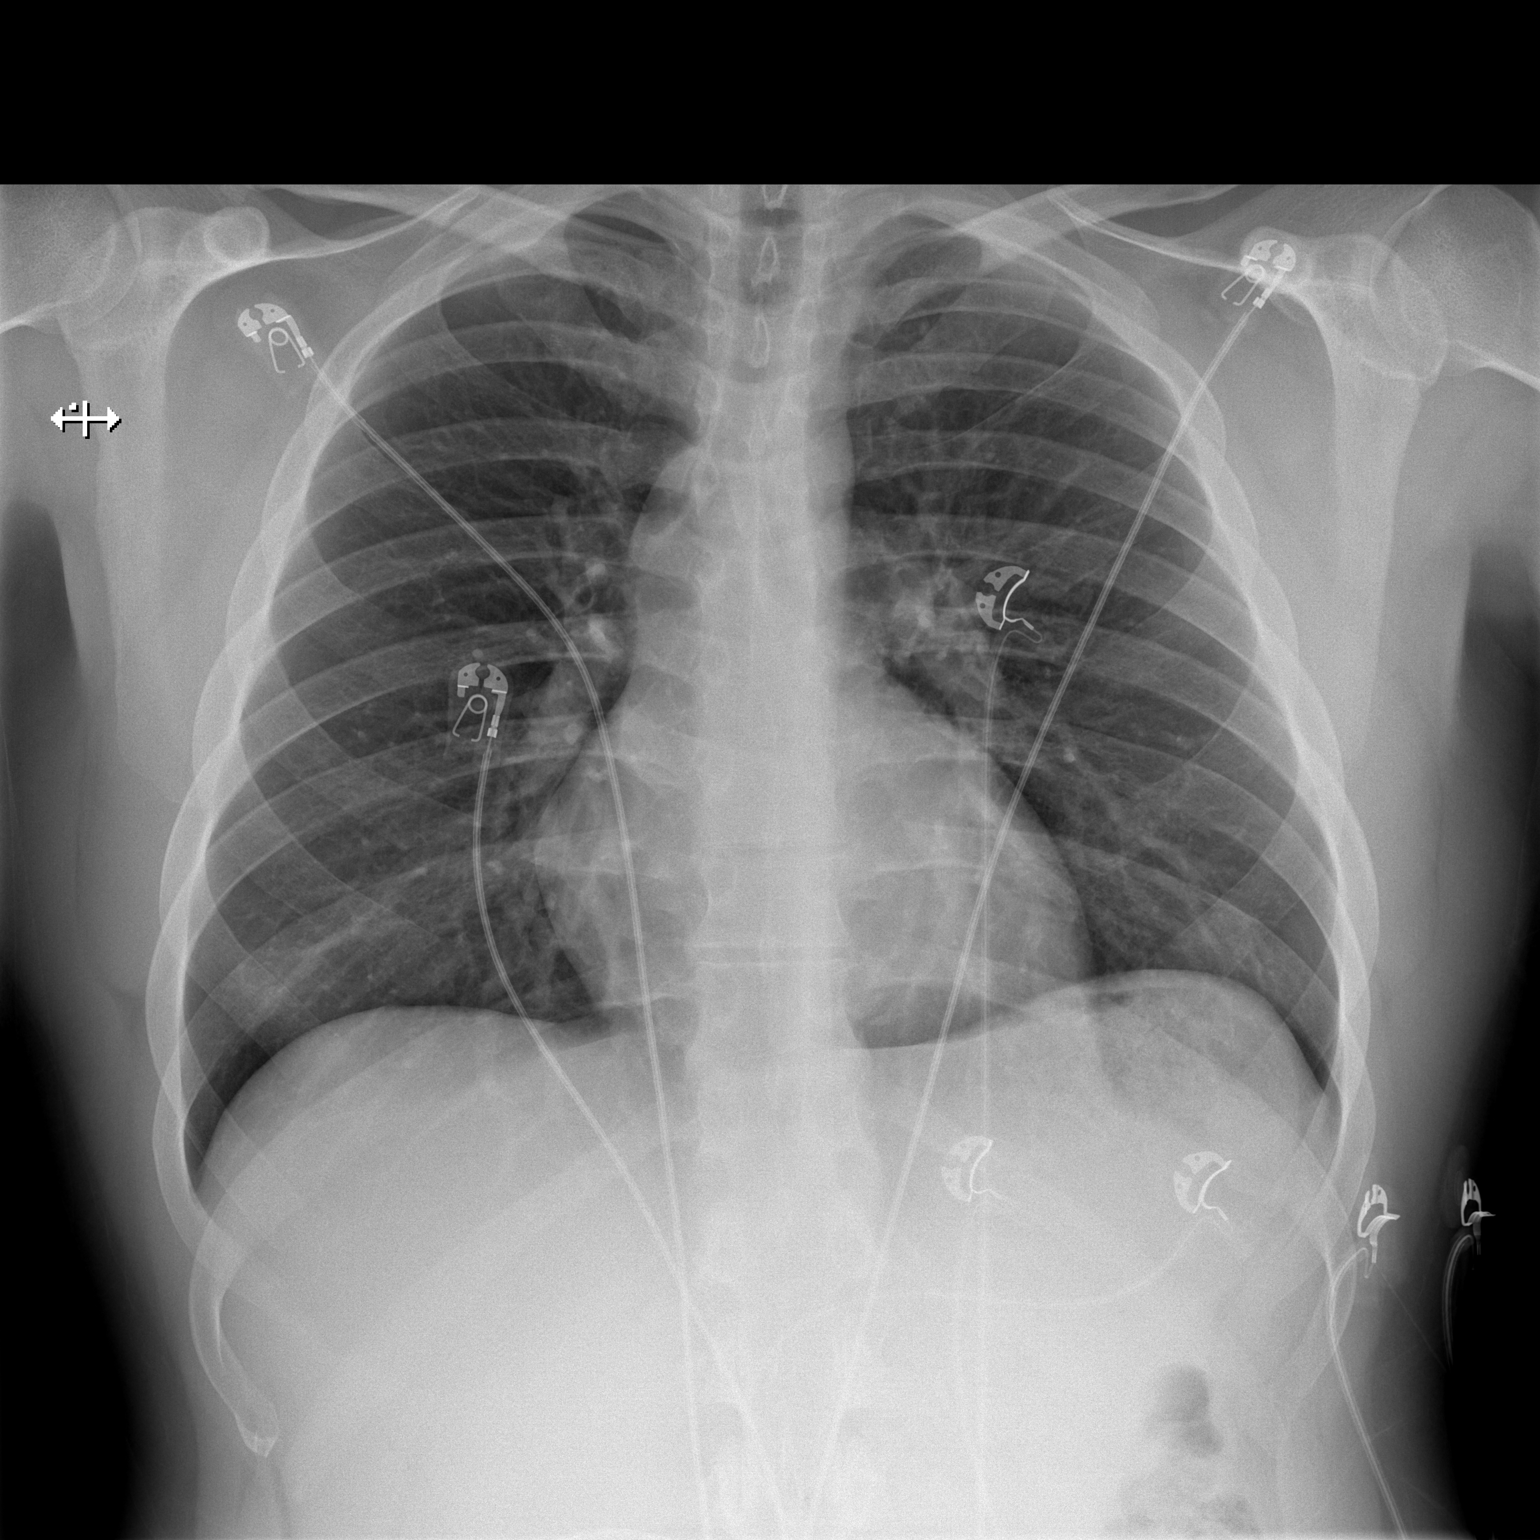

[w chest lat]
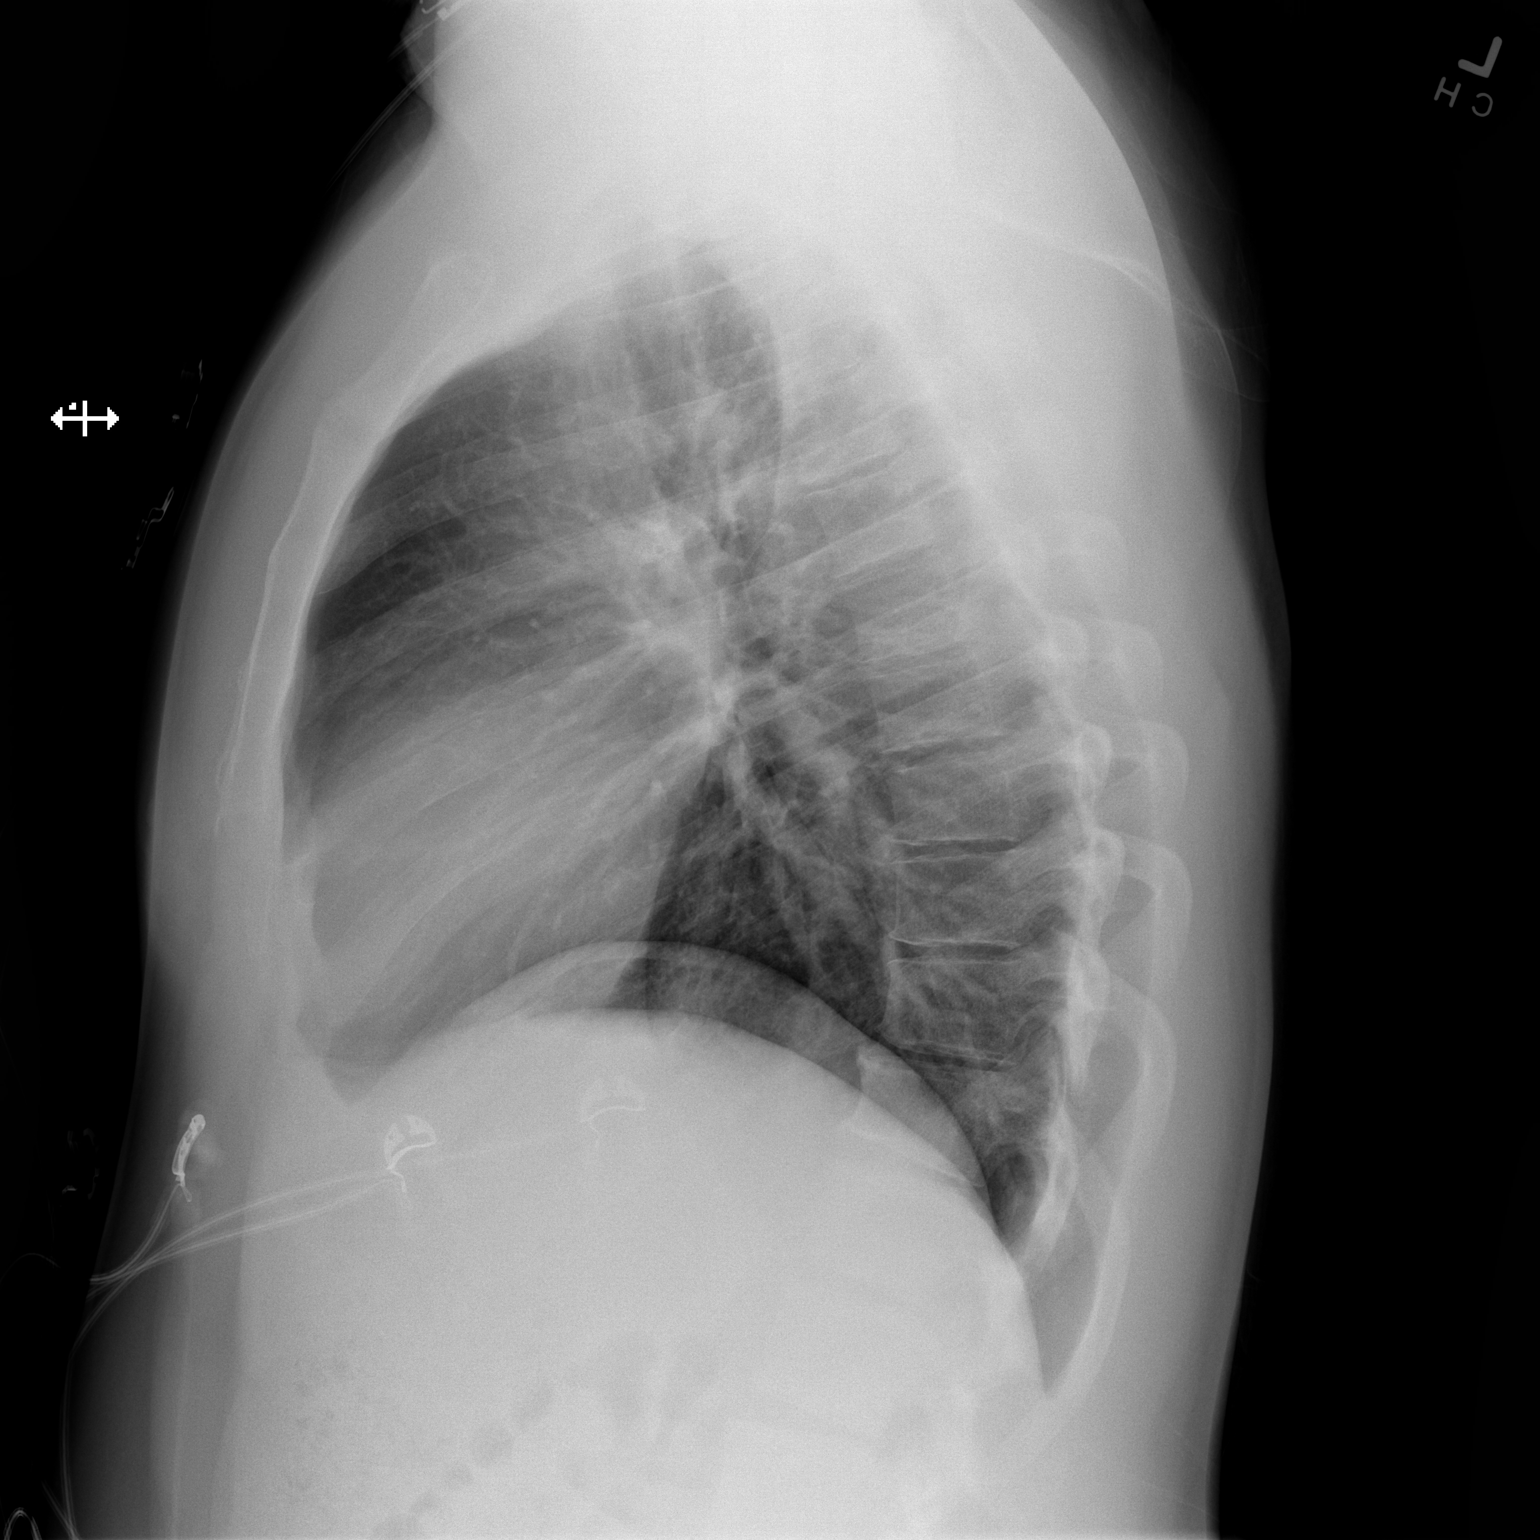

[2 of 2 positions shown; findings below may reference images not displayed]

FINDINGS: Heart size is within normal limits. Minimal lower thoracic vertebral
wedging. No acute airspace disease or effusion.
IMPRESSION: No active cardiopulmonary disease.

## 2022-05-22 DEATH — deceased
